# Patient Record
Sex: Male | Born: 1990 | Race: White | Hispanic: No | Marital: Married | Smoking: Current every day smoker
Health system: Southern US, Community
[De-identification: ages and names within clinical notes are randomized; demographics above are authoritative.]

## PROBLEM LIST (undated history)

## (undated) ENCOUNTER — Emergency Department (HOSPITAL_BASED_OUTPATIENT_CLINIC_OR_DEPARTMENT_OTHER): Admission: EM | Payer: Self-pay | Source: Home / Self Care

## (undated) DIAGNOSIS — F32A Depression, unspecified: Secondary | ICD-10-CM

## (undated) DIAGNOSIS — F1010 Alcohol abuse, uncomplicated: Secondary | ICD-10-CM

## (undated) DIAGNOSIS — K089 Disorder of teeth and supporting structures, unspecified: Secondary | ICD-10-CM

## (undated) DIAGNOSIS — F419 Anxiety disorder, unspecified: Secondary | ICD-10-CM

## (undated) DIAGNOSIS — F4310 Post-traumatic stress disorder, unspecified: Secondary | ICD-10-CM

## (undated) DIAGNOSIS — F329 Major depressive disorder, single episode, unspecified: Secondary | ICD-10-CM

## (undated) DIAGNOSIS — G8929 Other chronic pain: Secondary | ICD-10-CM

## (undated) DIAGNOSIS — K029 Dental caries, unspecified: Secondary | ICD-10-CM

## (undated) DIAGNOSIS — J40 Bronchitis, not specified as acute or chronic: Secondary | ICD-10-CM

---
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
## 2011-10-16 ENCOUNTER — Encounter: Payer: Self-pay | Admitting: Family Medicine

## 2011-10-16 ENCOUNTER — Emergency Department (HOSPITAL_BASED_OUTPATIENT_CLINIC_OR_DEPARTMENT_OTHER)
Admission: EM | Admit: 2011-10-16 | Discharge: 2011-10-16 | Disposition: A | Discharge status: Home / Self Care | Payer: Medicaid Other | Attending: Emergency Medicine | Admitting: Emergency Medicine

## 2011-10-16 DIAGNOSIS — M545 Low back pain, unspecified: Secondary | ICD-10-CM | POA: Insufficient documentation

## 2011-10-16 HISTORY — DX: Bronchitis, not specified as acute or chronic: J40

## 2011-10-16 MED ORDER — HYDROCODONE-ACETAMINOPHEN 5-500 MG PO TABS: 1.0000 | ORAL_TABLET | Freq: Four times a day (QID) | ORAL | Status: AC | PRN

## 2011-10-16 NOTE — ED Notes (Signed)
Pt c/o mid-back pain x 9 days after lifting heavy items and "slipping and falling down stairs". Pt able to ambulate and denies urinary symptoms.

## 2011-10-16 NOTE — ED Provider Notes (Signed)
History     CSN: [id number] Arrival date & time: 10/16/2011 10:35 AM   First MD Initiated Contact with Patient 10/16/11 1133      Chief Complaint  Patient presents with  . Back Pain    (Consider location/radiation/quality/duration/timing/severity/associated sxs/prior treatment) HPI Comments: Moving boxes into a storage facility.  Patient is a 20 y.o. male presenting with back pain. The history is provided by the patient.  Back Pain  This is a new problem. The current episode started more than 1 week ago. The problem occurs constantly. The problem has been gradually worsening. The pain is associated with lifting heavy objects. The pain is present in the lumbar spine. The quality of the pain is described as shooting and stabbing. The pain does not radiate. The pain is at a severity of 9/10. The pain is severe. The symptoms are aggravated by bending, twisting and certain positions. The pain is the same all the time. Pertinent negatives include no fever, no bowel incontinence and no bladder incontinence. He has tried NSAIDs for the symptoms. The treatment provided mild relief.    Past Medical History  Diagnosis Date  . Bronchitis     History reviewed. No pertinent past surgical history.  No family history on file.  History  Substance Use Topics  . Smoking status: Current Everyday Smoker  . Smokeless tobacco: Not on file  . Alcohol Use: Yes      Review of Systems  Constitutional: Negative for fever.  Gastrointestinal: Negative.  Negative for bowel incontinence.  Genitourinary: Negative.  Negative for bladder incontinence.  Musculoskeletal: Positive for back pain.  Neurological: Negative.   All other systems reviewed and are negative.    Allergies  Review of patient's allergies indicates no known allergies.  Home Medications  No current outpatient prescriptions on file.  BP 135/61  Pulse 97  Temp(Src) 98.2 F (36.8 C) (Oral)  Resp 16  SpO2 97%  Physical Exam    Nursing note and vitals reviewed. Constitutional: He is oriented to person, place, and time. He appears well-developed and well-nourished. No distress.  HENT:  Head: Normocephalic and atraumatic.  Neck: Normal range of motion. Neck supple.  Cardiovascular: Normal rate and regular rhythm.   Pulmonary/Chest: Effort normal and breath sounds normal.  Abdominal: Soft. He exhibits no distension. There is no tenderness.  Musculoskeletal:       Tender to palpation in the soft tissues in the lumbar region.  Neurological: He is alert and oriented to person, place, and time.  Skin: Skin is warm and dry. He is not diaphoretic.    ED Course  Procedures (including critical care time)  Labs Reviewed - No data to display No results found.   No diagnosis found.    MDM          Geoffery Lyons, MD 10/16/11 [phone]

## 2012-02-15 ENCOUNTER — Emergency Department (HOSPITAL_BASED_OUTPATIENT_CLINIC_OR_DEPARTMENT_OTHER)
Admission: EM | Admit: 2012-02-15 | Discharge: 2012-02-15 | Disposition: A | Discharge status: Home / Self Care | Payer: Self-pay | Attending: Emergency Medicine | Admitting: Emergency Medicine

## 2012-02-15 ENCOUNTER — Emergency Department (INDEPENDENT_AMBULATORY_CARE_PROVIDER_SITE_OTHER): Payer: Self-pay

## 2012-02-15 ENCOUNTER — Encounter (HOSPITAL_BASED_OUTPATIENT_CLINIC_OR_DEPARTMENT_OTHER): Payer: Self-pay

## 2012-02-15 DIAGNOSIS — R1110 Vomiting, unspecified: Secondary | ICD-10-CM | POA: Insufficient documentation

## 2012-02-15 DIAGNOSIS — J3489 Other specified disorders of nose and nasal sinuses: Secondary | ICD-10-CM | POA: Insufficient documentation

## 2012-02-15 DIAGNOSIS — R059 Cough, unspecified: Secondary | ICD-10-CM | POA: Insufficient documentation

## 2012-02-15 DIAGNOSIS — R05 Cough: Secondary | ICD-10-CM | POA: Insufficient documentation

## 2012-02-15 LAB — CBC
MCH: 30 pg (ref 26.0–34.0)
Platelets: 179 10*3/uL (ref 150–400)
RBC: 5.33 MIL/uL (ref 4.22–5.81)
WBC: 6 10*3/uL (ref 4.0–10.5)

## 2012-02-15 LAB — COMPREHENSIVE METABOLIC PANEL
ALT: 14 U/L (ref 0–53)
AST: 20 U/L (ref 0–37)
Albumin: 4 g/dL (ref 3.5–5.2)
CO2: 29 mEq/L (ref 19–32)
Calcium: 9.5 mg/dL (ref 8.4–10.5)
GFR calc non Af Amer: >90 mL/min (ref >90)
Sodium: 140 mEq/L (ref 135–145)
Total Protein: 7.1 g/dL (ref 6.0–8.3)

## 2012-02-15 LAB — DIFFERENTIAL
Eosinophils Relative: 4 % (ref 0–5)
Monocytes Absolute: 0.5 10*3/uL (ref 0.1–1.0)
Monocytes Relative: 9 % (ref 3–12)
Neutrophils Relative %: 49 % (ref 43–77)

## 2012-02-15 MED ORDER — ONDANSETRON HCL 4 MG/2ML IJ SOLN
4.0000 mg | Freq: Once | INTRAMUSCULAR | Status: AC
  Administered 2012-02-15: 4 mg via INTRAVENOUS
  Filled 2012-02-15: qty 2

## 2012-02-15 MED ORDER — SODIUM CHLORIDE 0.9 % IV SOLN
Freq: Once | INTRAVENOUS | Status: AC
  Administered 2012-02-15: 15:00:00 via INTRAVENOUS

## 2012-02-15 MED ORDER — AMOXICILLIN 500 MG PO CAPS: 500.0000 mg | ORAL_CAPSULE | Freq: Three times a day (TID) | ORAL | Status: AC

## 2012-02-15 MED ORDER — PROMETHAZINE HCL 25 MG PO TABS: 25.0000 mg | ORAL_TABLET | Freq: Four times a day (QID) | ORAL | Status: DC | PRN

## 2012-02-15 NOTE — Discharge Instructions (Signed)

## 2012-02-15 NOTE — ED Provider Notes (Signed)
History     CSN: [id number]  Arrival date & time 02/15/12  1315   First MD Initiated Contact with Patient 02/15/12 1354      Chief Complaint  Patient presents with  . Emesis    (Consider location/radiation/quality/duration/timing/severity/associated sxs/prior treatment) Patient is a 21 y.o. male presenting with vomiting. The history is provided by the patient. No language interpreter was used.  Emesis  This is a new problem. The current episode started more than 2 days ago. The problem occurs 5 to 10 times per day. The problem has been gradually worsening. The emesis has an appearance of stomach contents. There has been no fever. The fever has been present for less than 1 day. Associated symptoms include cough and URI. Pertinent negatives include no chills and no fever. Risk factors: cough and congestion,  Pt reports she has been vomitting for the past 3 days. Patient reports vomiting is not associated with cough. Patient reports he has had a cough and congestion he complains of sinus drainage and sinus congestion. Patient is concerned that he could have a sinus infection.  Past Medical History  Diagnosis Date  . Bronchitis     History reviewed. No pertinent past surgical history.  No family history on file.  History  Substance Use Topics  . Smoking status: Current Everyday Smoker  . Smokeless tobacco: Not on file  . Alcohol Use: Yes      Review of Systems  Constitutional: Negative for fever and chills.  Respiratory: Positive for cough.   Gastrointestinal: Positive for vomiting.  All other systems reviewed and are negative.    Allergies  Review of patient's allergies indicates no known allergies.  Home Medications  No current outpatient prescriptions on file.  BP 127/75  Pulse 56  Temp(Src) 98.4 F (36.9 C) (Oral)  Resp 16  Ht 5\' 6"  (1.676 m)  Wt 160 lb (72.576 kg)  BMI 25.82 kg/m2  SpO2 99%  Physical Exam  Nursing note and vitals  reviewed. Constitutional: He appears well-developed and well-nourished.  HENT:  Head: Normocephalic and atraumatic.  Right Ear: External ear normal.  Left Ear: External ear normal.  Nose: Nose normal.  Mouth/Throat: Oropharynx is clear and moist.       Maxillary sinus tenderness  Eyes: Conjunctivae are normal. Pupils are equal, round, and reactive to light.  Neck: Normal range of motion. Neck supple.  Cardiovascular: Normal rate.   Pulmonary/Chest: Effort normal.  Abdominal: Soft.  Musculoskeletal: Normal range of motion.  Neurological: He is alert.  Skin: Skin is warm.  Psychiatric: He has a normal mood and affect.    ED Course  Procedures (including critical care time)  Labs Reviewed  CBC - Abnormal; Notable for the following:    MCHC 36.7 (*)    All other components within normal limits  DIFFERENTIAL  COMPREHENSIVE METABOLIC PANEL   Dg Chest 2 View  02/15/2012  *RADIOLOGY REPORT*  Clinical Data: Cough, vomiting.  CHEST - 2 VIEW  Comparison: None  Findings: Heart and mediastinal contours are within normal limits. No focal opacities or effusions.  No acute bony abnormality.  IMPRESSION: Normal study.  Original Report Authenticated By: Cyndie Chime, M.D.     No diagnosis found.    MDM  Pt given Iv NS x 999,  Zofran Iv.  Pt reports feeling better.        Langston Masker, [place] 02/15/12 1620

## 2012-02-15 NOTE — ED Notes (Signed)
C/o n/v/d,HA,body aches x 3 days

## 2012-02-16 NOTE — ED Provider Notes (Signed)
Medical screening examination/treatment/procedure(s) were performed by non-physician practitioner and as supervising physician I was immediately available for consultation/collaboration.   Gwyneth Sprout, MD 02/16/12 [phone]

## 2012-05-19 ENCOUNTER — Encounter (HOSPITAL_BASED_OUTPATIENT_CLINIC_OR_DEPARTMENT_OTHER): Payer: Self-pay | Admitting: *Deleted

## 2012-05-19 DIAGNOSIS — S025XXA Fracture of tooth (traumatic), initial encounter for closed fracture: Secondary | ICD-10-CM | POA: Insufficient documentation

## 2012-05-19 DIAGNOSIS — F17200 Nicotine dependence, unspecified, uncomplicated: Secondary | ICD-10-CM | POA: Insufficient documentation

## 2012-05-19 DIAGNOSIS — X58XXXA Exposure to other specified factors, initial encounter: Secondary | ICD-10-CM | POA: Insufficient documentation

## 2012-05-19 NOTE — ED Notes (Signed)
Pt describes dental pain since lunch (bottom left) Tried Advil and "previous pain med without relief" Also c/o back pain since Tues.Hx same.

## 2012-05-20 ENCOUNTER — Emergency Department (HOSPITAL_BASED_OUTPATIENT_CLINIC_OR_DEPARTMENT_OTHER)
Admission: EM | Admit: 2012-05-20 | Discharge: 2012-05-20 | Disposition: A | Discharge status: Home / Self Care | Payer: Self-pay | Attending: Emergency Medicine | Admitting: Emergency Medicine

## 2012-05-20 DIAGNOSIS — M545 Low back pain: Secondary | ICD-10-CM

## 2012-05-20 DIAGNOSIS — K0889 Other specified disorders of teeth and supporting structures: Secondary | ICD-10-CM

## 2012-05-20 MED ORDER — PENICILLIN V POTASSIUM 500 MG PO TABS: 500.0000 mg | ORAL_TABLET | Freq: Four times a day (QID) | ORAL | Status: AC

## 2012-05-20 MED ORDER — OXYCODONE-ACETAMINOPHEN 5-325 MG PO TABS
2.0000 | ORAL_TABLET | Freq: Once | ORAL | Status: AC
  Administered 2012-05-20: 2 via ORAL
  Filled 2012-05-20: qty 2

## 2012-05-20 MED ORDER — OXYCODONE-ACETAMINOPHEN 5-325 MG PO TABS: 1.0000 | ORAL_TABLET | Freq: Four times a day (QID) | ORAL | Status: AC | PRN

## 2012-05-20 NOTE — ED Notes (Signed)
rx x 2 for penicillin and percocet- pt has a ride

## 2012-05-20 NOTE — Discharge Instructions (Signed)
Back Pain, Adult Low back pain is very common. About 1 in 5 people have back pain.The cause of low back pain is rarely dangerous. The pain often gets better over time.About half of people with a sudden onset of back pain feel better in just 2 weeks. About 8 in 10 people feel better by 6 weeks.  CAUSES Some common causes of back pain include:  Strain of the muscles or ligaments supporting the spine.   Wear and tear (degeneration) of the spinal discs.   Arthritis.   Direct injury to the back.  DIAGNOSIS Most of the time, the direct cause of low back pain is not known.However, back pain can be treated effectively even when the exact cause of the pain is unknown.Answering your caregiver's questions about your overall health and symptoms is one of the most accurate ways to make sure the cause of your pain is not dangerous. If your caregiver needs more information, he or she may order lab work or imaging tests (X-rays or MRIs).However, even if imaging tests show changes in your back, this usually does not require surgery. HOME CARE INSTRUCTIONS For many people, back pain returns.Since low back pain is rarely dangerous, it is often a condition that people can learn to manageon their own.   Remain active. It is stressful on the back to sit or stand in one place. Do not sit, drive, or stand in one place for more than 30 minutes at a time. Take short walks on level surfaces as soon as pain allows.Try to increase the length of time you walk each day.   Do not stay in bed.Resting more than 1 or 2 days can delay your recovery.   Do not avoid exercise or work.Your body is made to move.It is not dangerous to be active, even though your back may hurt.Your back will likely heal faster if you return to being active before your pain is gone.   Pay attention to your body when you bend and lift. Many people have less discomfortwhen lifting if they bend their knees, keep the load close to their  bodies,and avoid twisting. Often, the most comfortable positions are those that put less stress on your recovering back.   Find a comfortable position to sleep. Use a firm mattress and lie on your side with your knees slightly bent. If you lie on your back, put a pillow under your knees.   Only take over-the-counter or prescription medicines as directed by your caregiver. Over-the-counter medicines to reduce pain and inflammation are often the most helpful.Your caregiver may prescribe muscle relaxant drugs.These medicines help dull your pain so you can more quickly return to your normal activities and healthy exercise.   Put ice on the injured area.   Put ice in a plastic bag.   Place a towel between your skin and the bag.   Leave the ice on for 15 to 20 minutes, 3 to 4 times a day for the first 2 to 3 days. After that, ice and heat may be alternated to reduce pain and spasms.   Ask your caregiver about trying back exercises and gentle massage. This may be of some benefit.   Avoid feeling anxious or stressed.Stress increases muscle tension and can worsen back pain.It is important to recognize when you are anxious or stressed and learn ways to manage it.Exercise is a great option.  SEEK MEDICAL CARE IF:  You have pain that is not relieved with rest or medicine.   You have   back pain. It is important to recognize when you are anxious or stressed and learn ways to manage it. Exercise is a great option.   SEEK MEDICAL CARE IF:   You have pain that is not relieved with rest or medicine.    You have pain that does not improve in 1 week.    You have new symptoms.    You are generally not feeling well.   SEEK IMMEDIATE MEDICAL CARE IF:     You have pain that radiates from your back into your legs.    You develop new bowel or bladder control problems.    You have unusual weakness or numbness in your arms or legs.    You develop nausea or vomiting.    You develop abdominal pain.    You feel faint.    Document Released: 12/11/2005 Document Revised: 11/30/2011 Document Reviewed: 05/01/2011  [employer] Patient Information 2012 [employer].    Back Pain, Adult  Low back pain is very common. About 1 in 5 people have back pain. The cause of low back pain is rarely dangerous. The pain often gets better over time. About half of people with a sudden onset of back pain feel better in just 2 weeks. About 8 in 10 people feel better by 6 weeks.    CAUSES  Some common causes of back pain include:   Strain of the muscles or ligaments supporting the spine.    Wear and tear (degeneration) of the spinal discs.    Arthritis.    Direct injury to the back.   DIAGNOSIS  Most of the time, the direct cause of low back pain is not known. However, back pain can be treated effectively even when the exact cause of the pain is unknown. Answering your caregiver's questions about your overall health and symptoms is one of the most accurate ways to make sure the cause of your pain is not dangerous. If your caregiver needs more information, he or she may order lab work or imaging tests (X-rays or MRIs). However, even if imaging tests show changes in your back, this usually does not require surgery.  HOME CARE INSTRUCTIONS  For many people, back pain returns. Since low back pain is rarely dangerous, it is often a condition that people can learn to manage on their own.     Remain active. It is stressful on the back to sit or stand in one place. Do not sit, drive, or stand in one place for more than 30 minutes at a time. Take short walks on level surfaces as soon as pain allows. Try to increase the length of time you walk each day.    Do not stay in bed. Resting more than 1 or 2 days can delay your recovery.    Do not avoid exercise or work. Your body is made to move. It is not dangerous to be active, even though your back may hurt. Your back will likely heal faster if you return to being active before your pain is gone.     Pay attention to your body when you  bend and lift. Many people have less discomfort when lifting if they bend their knees, keep the load close to their bodies, and avoid twisting. Often, the most comfortable positions are those that put less stress on your recovering back.    Find a comfortable position to sleep. Use a firm mattress and lie on your side with your knees slightly bent. If you lie on   4 times a day for the first 2 to 3 days. After that, ice and heat may be alternated to reduce pain and spasms.   Ask your caregiver about trying back exercises and gentle massage. This may be of some benefit.   Avoid feeling anxious or stressed.Stress increases muscle tension and can worsen back pain.It is important to recognize when you are anxious or stressed and learn ways to manage it.Exercise is a great option.  SEEK MEDICAL CARE IF:  You have pain that is not relieved with rest or medicine.   You have pain that does not improve in 1 week.   You have new symptoms.   You are generally not feeling well.  SEEK IMMEDIATE MEDICAL CARE IF:   You have pain that radiates from your back into your legs.   You develop new bowel or bladder control problems.   You have unusual weakness or numbness in your arms or legs.   You develop nausea or vomiting.   You develop abdominal pain.   You feel faint.  Document Released: 12/11/2005 Document Revised: 11/30/2011 Document Reviewed: 05/01/2011 [facility] Patient Information 2012 [place], [place].Back Pain, Adult Low back pain is very common. About 1 in 5 people have back pain.The cause of low back pain is rarely dangerous. The pain often gets better over  time.About half of people with a sudden onset of back pain feel better in just 2 weeks. About 8 in 10 people feel better by 6 weeks.  CAUSES Some common causes of back pain include:  Strain of the muscles or ligaments supporting the spine.   Wear and tear (degeneration) of the spinal discs.   Arthritis.   Direct injury to the back.  DIAGNOSIS Most of the time, the direct cause of low back pain is not known.However, back pain can be treated effectively even when the exact cause of the pain is unknown.Answering your caregiver's questions about your overall health and symptoms is one of the most accurate ways to make sure the cause of your pain is not dangerous. If your caregiver needs more information, he or she may order lab work or imaging tests (X-rays or MRIs).However, even if imaging tests show changes in your back, this usually does not require surgery. HOME CARE INSTRUCTIONS For many people, back pain returns.Since low back pain is rarely dangerous, it is often a condition that people can learn to [facility] their own.   Remain active. It is stressful on the back to sit or stand in one place. Do not sit, drive, or stand in one place for more than 30 minutes at a time. Take short walks on level surfaces as soon as pain allows.Try to increase the length of time you walk each day.   Do not stay in bed.Resting more than 1 or 2 days can delay your recovery.   Do not avoid exercise or work.Your body is made to move.It is not dangerous to be active, even though your back may hurt.Your back will likely heal faster if you return to being active before your pain is gone.   Pay attention to your body when you bend and lift. Many people have less discomfortwhen lifting if they bend their knees, keep the load close to their bodies,and avoid twisting. Often, the most comfortable positions are those that put less stress on your recovering back.   Find a comfortable position to sleep. Use a  firm mattress and lie on your side with your knees slightly bent. If you lie  on your back, put a pillow under your knees.   Only take over-the-counter or prescription medicines as directed by your caregiver. Over-the-counter medicines to reduce pain and inflammation are often the most helpful.Your caregiver may prescribe muscle relaxant drugs.These medicines help dull your pain so you can more quickly return to your normal activities and healthy exercise.   Put ice on the injured area.   Put ice in a plastic bag.   Place a towel between your skin and the bag.   Leave the ice on for 15 to 20 minutes, 3 to 4 times a day for the first 2 to 3 days. After that, ice and heat may be alternated to reduce pain and spasms.   Ask your caregiver about trying back exercises and gentle massage. This may be of some benefit.   Avoid feeling anxious or stressed.Stress increases muscle tension and can worsen back pain.It is important to recognize when you are anxious or stressed and learn ways to manage it.Exercise is a great option.  SEEK MEDICAL CARE IF:  You have pain that is not relieved with rest or medicine.   You have pain that does not improve in 1 week.   You have new symptoms.   You are generally not feeling well.  SEEK IMMEDIATE MEDICAL CARE IF:   You have pain that radiates from your back into your legs.   You develop new bowel or bladder control problems.   You have unusual weakness or numbness in your arms or legs.   You develop nausea or vomiting.   You develop abdominal pain.   You feel faint.  Document Released: 12/11/2005 Document Revised: 11/30/2011 Document Reviewed: 05/01/2011 [facility] Patient Information 2012 [place], [place].

## 2012-05-20 NOTE — ED Provider Notes (Signed)
History     CSN: [id number]  Arrival date & time 05/19/12  2331   First MD Initiated Contact with Patient 05/20/12 0154      Chief Complaint  Patient presents with  . Dental Pain    (Consider location/radiation/quality/duration/timing/severity/associated sxs/prior treatment) HPI Is a 21 year old white male with a long-standing history of fractured left lower second molar. He began to have pain in that tooth yesterday about lunchtime and the pain is subsequently become severe. It is worse with eating or drinking. He has taken Advil and hydrocodone without relief. He is also complaining of low back pain for the last 6 days following a fall. The pain was not immediate but started the next day. He has a history of similar low back pain. He does not have a Education officer, community.  Past Medical History  Diagnosis Date  . Bronchitis     History reviewed. No pertinent past surgical history.  History reviewed. No pertinent family history.  History  Substance Use Topics  . Smoking status: Current Everyday Smoker  . Smokeless tobacco: Not on file  . Alcohol Use: Yes      Review of Systems  All other systems reviewed and are negative.    Allergies  Review of patient's allergies indicates no known allergies.  Home Medications  No current outpatient prescriptions on file.  BP 142/94  Pulse 90  Temp(Src) 98.2 F (36.8 C) (Oral)  Resp 20  Ht 5\' 6"  (1.676 m)  Wt 156 lb (70.761 kg)  BMI 25.18 kg/m2  SpO2 97%  Physical Exam General: Well-developed, well-nourished male in no acute distress; appearance consistent with age of record HENT: normocephalic, atraumatic; multiple amalgam dental restorations; fractured left lower second molar with superimposed caries, tender to percussion Eyes: pupils equal round and reactive to light; extraocular muscles intact Neck: supple Heart: regular rate and rhythm Lungs: clear to auscultation bilaterally Abdomen: soft; nondistended Back: Paraspinal  lumbar tenderness Extremities: No deformity; full range of motion normal Neurologic: Awake, alert and oriented; motor function intact in all extremities and symmetric; no facial droop Skin: Warm and dry Psychiatric: Flat affect    ED Course  Procedures (including critical care time)     MDM          Hanley Seamen, MD 05/20/12 0159

## 2012-05-22 ENCOUNTER — Telehealth (HOSPITAL_BASED_OUTPATIENT_CLINIC_OR_DEPARTMENT_OTHER): Payer: Self-pay | Admitting: *Deleted

## 2012-05-22 NOTE — ED Notes (Signed)
Patient called stating he needs a new referral to a dentist.  Explained that the dentist on call for [facility] was the one we referred him to and they have agreed to see our patients.  Patient encouraged to tell dentist when calling that he was seen at the [facility] ED.  Patient states he does not have insurance.  Info given for the [facility] [employer] for possible assistance.  Encouraged that this tooth needs to be fixed or problems will continue and return.

## 2012-12-05 ENCOUNTER — Emergency Department (HOSPITAL_BASED_OUTPATIENT_CLINIC_OR_DEPARTMENT_OTHER)
Admission: EM | Admit: 2012-12-05 | Discharge: 2012-12-06 | Disposition: A | Discharge status: Home / Self Care | Payer: Self-pay | Attending: Emergency Medicine | Admitting: Emergency Medicine

## 2012-12-05 ENCOUNTER — Encounter (HOSPITAL_BASED_OUTPATIENT_CLINIC_OR_DEPARTMENT_OTHER): Payer: Self-pay | Admitting: Emergency Medicine

## 2012-12-05 ENCOUNTER — Emergency Department (HOSPITAL_BASED_OUTPATIENT_CLINIC_OR_DEPARTMENT_OTHER): Payer: Self-pay

## 2012-12-05 DIAGNOSIS — F17200 Nicotine dependence, unspecified, uncomplicated: Secondary | ICD-10-CM | POA: Insufficient documentation

## 2012-12-05 DIAGNOSIS — Z8709 Personal history of other diseases of the respiratory system: Secondary | ICD-10-CM | POA: Insufficient documentation

## 2012-12-05 DIAGNOSIS — R569 Unspecified convulsions: Secondary | ICD-10-CM

## 2012-12-05 DIAGNOSIS — R51 Headache: Secondary | ICD-10-CM | POA: Insufficient documentation

## 2012-12-05 LAB — CBC WITH DIFFERENTIAL/PLATELET
Basophils Relative: 1 % (ref 0–1)
Eosinophils Absolute: 0.2 10*3/uL (ref 0.0–0.7)
HCT: 42.2 % (ref 39.0–52.0)
Hemoglobin: 15.7 g/dL (ref 13.0–17.0)
Lymphs Abs: 2.3 10*3/uL (ref 0.7–4.0)
MCH: 30.9 pg (ref 26.0–34.0)
MCHC: 37.2 g/dL — ABNORMAL HIGH (ref 30.0–36.0)
MCV: 83.1 fL (ref 78.0–100.0)
Monocytes Absolute: 0.7 10*3/uL (ref 0.1–1.0)
Neutro Abs: 4.1 10*3/uL (ref 1.7–7.7)

## 2012-12-05 LAB — COMPREHENSIVE METABOLIC PANEL
BUN: 13 mg/dL (ref 6–23)
CO2: 26 mEq/L (ref 19–32)
Calcium: 9.4 mg/dL (ref 8.4–10.5)
GFR calc Af Amer: >90 mL/min (ref >90)
GFR calc non Af Amer: >90 mL/min (ref >90)
Glucose, Bld: 99 mg/dL (ref 70–99)
Total Protein: 6.6 g/dL (ref 6.0–8.3)

## 2012-12-05 MED ORDER — PROMETHAZINE HCL 25 MG/ML IJ SOLN
25.0000 mg | Freq: Once | INTRAMUSCULAR | Status: AC
  Administered 2012-12-05: 25 mg via INTRAMUSCULAR
  Filled 2012-12-05: qty 1

## 2012-12-05 MED ORDER — KETOROLAC TROMETHAMINE 60 MG/2ML IM SOLN
60.0000 mg | Freq: Once | INTRAMUSCULAR | Status: AC
  Administered 2012-12-05: 60 mg via INTRAMUSCULAR
  Filled 2012-12-05: qty 2

## 2012-12-05 NOTE — ED Provider Notes (Signed)
History     CSN: [id number]  Arrival date & time 12/05/12  2215   First MD Initiated Contact with Patient 12/05/12 2301      Chief Complaint  Patient presents with  . Headache    (Consider location/radiation/quality/duration/timing/severity/associated sxs/prior treatment) HPI Comments: Patient presents here with headaches for the past two weeks, now having blurry vision.  This evening he reports he passed out and had seizure-like activity.  This was witnessed by his girlfriend.  No bowel or bladder incontinence.  He tells me he has been "taking aleve like skittles" but has obtained no relief.  Patient is a 21 y.o. male presenting with headaches. The history is provided by the patient.  Headache  This is a new problem. Episode onset: 2 weeks ago. The problem occurs constantly. The problem has been gradually worsening. The headache is associated with bright light and activity. The pain is located in the right unilateral region. The quality of the pain is described as throbbing. The pain is severe. The pain does not radiate. Pertinent negatives include no fever, no nausea and no vomiting.    Past Medical History  Diagnosis Date  . Bronchitis     History reviewed. No pertinent past surgical history.  History reviewed. No pertinent family history.  History  Substance Use Topics  . Smoking status: Current Every Day Smoker  . Smokeless tobacco: Not on file  . Alcohol Use: Yes      Review of Systems  Constitutional: Negative for fever.  Gastrointestinal: Negative for nausea and vomiting.  Neurological: Positive for headaches.  All other systems reviewed and are negative.    Allergies  Review of patient's allergies indicates no known allergies.  Home Medications  No current outpatient prescriptions on file.  BP 158/109  Pulse 80  Temp 98.9 F (37.2 C)  Resp 16  SpO2 100%  Physical Exam  Nursing note and vitals reviewed. Constitutional: He is oriented to person,  place, and time. He appears well-developed and well-nourished. No distress.  HENT:  Head: Normocephalic and atraumatic.  Mouth/Throat: Oropharynx is clear and moist.  Eyes: EOM are normal. Pupils are equal, round, and reactive to light.       No papilledema.  Neck: Normal range of motion. Neck supple.  Cardiovascular: Normal rate.   No murmur heard. Pulmonary/Chest: Effort normal and breath sounds normal. No respiratory distress.  Abdominal: Soft. Bowel sounds are normal.  Musculoskeletal: Normal range of motion.  Lymphadenopathy:    He has no cervical adenopathy.  Neurological: He is alert and oriented to person, place, and time. No cranial nerve deficit. He exhibits normal muscle tone. Coordination normal.  Skin: Skin is warm and dry. He is not diaphoretic.    ED Course  Procedures (including critical care time)   Labs Reviewed  CBC WITH DIFFERENTIAL  COMPREHENSIVE METABOLIC PANEL   No results found.   No diagnosis found.    MDM  The patient presents with headaches and apparent seizure activity.  The labs, ct, and exam are unremarkable.  There are no indications for LP.  Will discharge to home and give neurology follow up information.          Geoffery Lyons, MD 12/06/12 [phone]

## 2012-12-05 NOTE — ED Notes (Signed)
Pt reports mild headache x 2 week, then started having severe migraine this week took migraine without relief, pt reports getting dizzy today upon standing up, past out loss consiousness and had no recollection of event, significant other reports pt's eye rolled in back of head, pt was unresponsive and he had a "shaking spell"

## 2012-12-06 MED ORDER — TRAMADOL HCL 50 MG PO TABS: 50.0000 mg | ORAL_TABLET | Freq: Four times a day (QID) | ORAL | Status: DC | PRN

## 2013-12-01 ENCOUNTER — Emergency Department (HOSPITAL_BASED_OUTPATIENT_CLINIC_OR_DEPARTMENT_OTHER)
Admission: EM | Admit: 2013-12-01 | Discharge: 2013-12-01 | Disposition: A | Discharge status: Home / Self Care | Payer: Medicaid Other | Attending: Emergency Medicine | Admitting: Emergency Medicine

## 2013-12-01 ENCOUNTER — Encounter (HOSPITAL_BASED_OUTPATIENT_CLINIC_OR_DEPARTMENT_OTHER): Payer: Self-pay | Admitting: Emergency Medicine

## 2013-12-01 DIAGNOSIS — K047 Periapical abscess without sinus: Secondary | ICD-10-CM

## 2013-12-01 DIAGNOSIS — K0889 Other specified disorders of teeth and supporting structures: Secondary | ICD-10-CM

## 2013-12-01 DIAGNOSIS — F17200 Nicotine dependence, unspecified, uncomplicated: Secondary | ICD-10-CM | POA: Insufficient documentation

## 2013-12-01 DIAGNOSIS — K044 Acute apical periodontitis of pulpal origin: Secondary | ICD-10-CM | POA: Insufficient documentation

## 2013-12-01 DIAGNOSIS — K089 Disorder of teeth and supporting structures, unspecified: Secondary | ICD-10-CM | POA: Insufficient documentation

## 2013-12-01 DIAGNOSIS — Z8709 Personal history of other diseases of the respiratory system: Secondary | ICD-10-CM | POA: Insufficient documentation

## 2013-12-01 DIAGNOSIS — K029 Dental caries, unspecified: Secondary | ICD-10-CM | POA: Insufficient documentation

## 2013-12-01 MED ORDER — HYDROCODONE-ACETAMINOPHEN 5-325 MG PO TABS
2.0000 | ORAL_TABLET | Freq: Once | ORAL | Status: AC
  Administered 2013-12-01: 2 via ORAL
  Filled 2013-12-01: qty 2

## 2013-12-01 MED ORDER — AMOXICILLIN 500 MG PO CAPS: 500.0000 mg | ORAL_CAPSULE | Freq: Three times a day (TID) | ORAL | Status: DC

## 2013-12-01 MED ORDER — HYDROCODONE-ACETAMINOPHEN 5-325 MG PO TABS: 1.0000 | ORAL_TABLET | ORAL | Status: DC | PRN

## 2013-12-01 NOTE — ED Notes (Signed)
Toothache x 2 months on and off.

## 2013-12-01 NOTE — ED Provider Notes (Signed)
CSN: [id number]     Arrival date & time 12/01/13  1906 History   First MD Initiated Contact with Patient 12/01/13 1913     Chief Complaint  Patient presents with  . Dental Pain   (Consider location/radiation/quality/duration/timing/severity/associated sxs/prior Treatment) HPI Comments: Pt presents to the ED complaining of left lower dental pain x 1 year intermittently worsening today over the past few hours. Pain severe, 10/10, worse with talking, breathing and eating. He does not have a dentist. Tried taking ibuprofen without relief. Denies fever, difficulty swallowing.  Patient is a 22 y.o. male presenting with tooth pain. The history is provided by the patient.  Dental Pain   Past Medical History  Diagnosis Date  . Bronchitis    History reviewed. No pertinent past surgical history. No family history on file. History  Substance Use Topics  . Smoking status: Current Every Day Smoker -- 1.50 packs/day    Types: Cigarettes  . Smokeless tobacco: Not on file  . Alcohol Use: Yes    Review of Systems  HENT: Positive for dental problem.   All other systems reviewed and are negative.    Allergies  Review of patient's allergies indicates no known allergies.  Home Medications   Current Outpatient Rx  Name  Route  Sig  Dispense  Refill  . amoxicillin (AMOXIL) 500 MG capsule   Oral   Take 1 capsule (500 mg total) by mouth 3 (three) times daily.   21 capsule   0   . HYDROcodone-acetaminophen (NORCO/VICODIN) 5-325 MG per tablet   Oral   Take 1-2 tablets by mouth every 4 (four) hours as needed.   6 tablet   0   . traMADol (ULTRAM) 50 MG tablet   Oral   Take 1 tablet (50 mg total) by mouth every 6 (six) hours as needed for pain.   15 tablet   0    BP 150/97  Pulse 64  Temp(Src) 97.7 F (36.5 C) (Oral)  Resp 18  Ht 5\' 6"  (1.676 m)  Wt 170 lb (77.111 kg)  BMI 27.45 kg/m2  SpO2 99% Physical Exam  Nursing note and vitals reviewed. Constitutional: He is oriented to  person, place, and time. He appears well-developed and well-nourished. No distress.  HENT:  Head: Normocephalic and atraumatic.  Mouth/Throat: Oropharynx is clear and moist.  Poor dentition throughout. Multiple dental caries and decaying teeth. Erythema and edema without abscess around lower left third molar. No trismus.  Eyes: Conjunctivae are normal.  Neck: Normal range of motion. Neck supple.  Cardiovascular: Normal rate, regular rhythm and normal heart sounds.   Pulmonary/Chest: Effort normal and breath sounds normal.  Abdominal: Bowel sounds are normal.  Musculoskeletal: Normal range of motion. He exhibits no edema.  Neurological: He is alert and oriented to person, place, and time.  Skin: Skin is warm and dry. He is not diaphoretic.  Psychiatric: He has a normal mood and affect. His behavior is normal.    ED Course  Procedures (including critical care time) Labs Review Labs Reviewed - No data to display Imaging Review No results found.  EKG Interpretation   None       MDM   1. Pain, dental   2. Dental infection     Dental pain associated with dental infection. No evidence of dental abscess. Patient is afebrile, non toxic appearing and swallowing secretions well. I gave patient referral to dentist and stressed the importance of dental follow up for ultimate management of dental pain. I will  also give amoxicillin and pain control. Patient voices understanding and is agreeable to plan.     Trevor Mace, PA-C 12/01/13 1942

## 2013-12-01 NOTE — ED Provider Notes (Signed)
Medical screening examination/treatment/procedure(s) were performed by non-physician practitioner and as supervising physician I was immediately available for consultation/collaboration.  EKG Interpretation   None        Juliet Rude. Rubin Payor, MD 12/01/13 2330

## 2014-02-22 ENCOUNTER — Emergency Department (HOSPITAL_BASED_OUTPATIENT_CLINIC_OR_DEPARTMENT_OTHER)
Admission: EM | Admit: 2014-02-22 | Discharge: 2014-02-22 | Disposition: A | Discharge status: Home / Self Care | Payer: Medicaid Other | Attending: Emergency Medicine | Admitting: Emergency Medicine

## 2014-02-22 ENCOUNTER — Encounter (HOSPITAL_BASED_OUTPATIENT_CLINIC_OR_DEPARTMENT_OTHER): Payer: Self-pay | Admitting: Emergency Medicine

## 2014-02-22 DIAGNOSIS — K056 Periodontal disease, unspecified: Secondary | ICD-10-CM | POA: Insufficient documentation

## 2014-02-22 DIAGNOSIS — Z8709 Personal history of other diseases of the respiratory system: Secondary | ICD-10-CM | POA: Insufficient documentation

## 2014-02-22 DIAGNOSIS — F17200 Nicotine dependence, unspecified, uncomplicated: Secondary | ICD-10-CM | POA: Insufficient documentation

## 2014-02-22 DIAGNOSIS — K029 Dental caries, unspecified: Secondary | ICD-10-CM

## 2014-02-22 DIAGNOSIS — K069 Disorder of gingiva and edentulous alveolar ridge, unspecified: Secondary | ICD-10-CM

## 2014-02-22 DIAGNOSIS — K089 Disorder of teeth and supporting structures, unspecified: Secondary | ICD-10-CM | POA: Insufficient documentation

## 2014-02-22 MED ORDER — AMOXICILLIN 500 MG PO CAPS
500.0000 mg | ORAL_CAPSULE | Freq: Once | ORAL | Status: AC
  Administered 2014-02-22: 500 mg via ORAL
  Filled 2014-02-22: qty 1

## 2014-02-22 MED ORDER — HYDROCODONE-ACETAMINOPHEN 5-325 MG PO TABS
1.0000 | ORAL_TABLET | Freq: Once | ORAL | Status: AC
  Administered 2014-02-22: 1 via ORAL
  Filled 2014-02-22: qty 1

## 2014-02-22 MED ORDER — AMOXICILLIN 500 MG PO CAPS: 500.0000 mg | ORAL_CAPSULE | Freq: Three times a day (TID) | ORAL | Status: DC

## 2014-02-22 MED ORDER — HYDROCODONE-ACETAMINOPHEN 5-325 MG PO TABS: 2.0000 | ORAL_TABLET | ORAL | Status: DC | PRN

## 2014-02-22 MED ORDER — NAPROXEN 500 MG PO TABS: 500.0000 mg | ORAL_TABLET | Freq: Two times a day (BID) | ORAL | Status: DC

## 2014-02-22 NOTE — ED Provider Notes (Signed)
CSN: [id number]     Arrival date & time 02/22/14  2159 History   First MD Initiated Contact with Patient 02/22/14 2218     Chief Complaint  Patient presents with  . Dental Pain     (Consider location/radiation/quality/duration/timing/severity/associated sxs/prior Treatment) Patient is a 23 y.o. male presenting with tooth pain. The history is provided by the patient.  Dental Pain Location:  Upper and lower Upper teeth location:  14/LU 1st molar and 15/LU 2nd molar Lower teeth location:  17/LL 3rd molar, 18/LL 2nd molar and 19/LL 1st molar Quality:  Throbbing and constant Severity:  Severe Onset quality:  Gradual Duration:  3 hours Timing:  Constant Progression:  Worsening Chronicity:  New Relieved by:  Nothing Worsened by:  Cold food/drink, jaw movement, pressure and touching Ineffective treatments:  NSAIDs  TAYON PAREKH is a 23 y.o. male who presents to the ED with dental pain in the left upper and lower dental area. He was eating and bit down and had severe pain. The pain has gradually increased. He has a lot of decayed teeth and knows that they are a problem. He is getting dental insurance next week at work and will be able to see a dentist after that.   Past Medical History  Diagnosis Date  . Bronchitis    History reviewed. No pertinent past surgical history. History reviewed. No pertinent family history. History  Substance Use Topics  . Smoking status: Current Every Day Smoker -- 1.50 packs/day    Types: Cigarettes  . Smokeless tobacco: Not on file  . Alcohol Use: Yes    Review of Systems Negative except as stated in HPI   Allergies  Review of patient's allergies indicates no known allergies.  Home Medications   Current Outpatient Rx  Name  Route  Sig  Dispense  Refill  . amoxicillin (AMOXIL) 500 MG capsule   Oral   Take 1 capsule (500 mg total) by mouth 3 (three) times daily.   21 capsule   0   . HYDROcodone-acetaminophen (NORCO/VICODIN) 5-325 MG per  tablet   Oral   Take 1-2 tablets by mouth every 4 (four) hours as needed.   6 tablet   0   . traMADol (ULTRAM) 50 MG tablet   Oral   Take 1 tablet (50 mg total) by mouth every 6 (six) hours as needed for pain.   15 tablet   0    BP 138/85  Pulse 79  Temp(Src) 97.6 F (36.4 C) (Oral)  Resp 18  Ht 5\' 6"  (1.676 m)  Wt 171 lb (77.565 kg)  BMI 27.61 kg/m2  SpO2 100% Physical Exam  Nursing note and vitals reviewed. Constitutional: He is oriented to person, place, and time. He appears well-developed and well-nourished. No distress.  HENT:  Head: Atraumatic.  Right Ear: Tympanic membrane normal.  Left Ear: Tympanic membrane normal.  Nose: Nose normal.  Mouth/Throat: Uvula is midline, oropharynx is clear and moist and mucous membranes are normal.    Multiple caries, gum surrounding upper and lower teeth with erythema and swelling, tender on exam.   Eyes: EOM are normal.  Neck: Neck supple.  Cardiovascular: Normal rate and regular rhythm.   Pulmonary/Chest: Effort normal. He has no wheezes. He has no rales.  Musculoskeletal: Normal range of motion.  Neurological: He is alert and oriented to person, place, and time. No cranial nerve deficit.  Skin: Skin is warm and dry.  Psychiatric: He has a normal mood and affect. His behavior is  normal.    ED Course  Procedures  MDM  23 y.o. male with multiple dental caries and dental pain and gum disease. Will treat with antibiotics and pain medication. He will schedule follow up with a dentist.  Discussed with the patient and all questioned fully answered. He will return if any problems arise.    Medication List    TAKE these medications       amoxicillin 500 MG capsule  Commonly known as:  AMOXIL  Take 1 capsule (500 mg total) by mouth 3 (three) times daily.     HYDROcodone-acetaminophen 5-325 MG per tablet  Commonly known as:  NORCO/VICODIN  Take 2 tablets by mouth every 4 (four) hours as needed.     naproxen 500 MG tablet    Commonly known as:  NAPROSYN  Take 1 tablet (500 mg total) by mouth 2 (two) times daily.      ASK your doctor about these medications       traMADol 50 MG tablet  Commonly known as:  ULTRAM  Take 1 tablet (50 mg total) by mouth every 6 (six) hours as needed for pain.           Janne NapoleonHope M Aadil Sur, [place]NP 02/22/14 [id number]

## 2014-02-22 NOTE — ED Notes (Signed)
Would like some pain relief and antibiotic, no insurance currently, will obtain insurance soon

## 2014-02-22 NOTE — Discharge Instructions (Signed)
Dental Pain Toothache is pain in or around a tooth. It may get worse with chewing or with cold or heat.  HOME CARE  Your dentist may use a numbing medicine during treatment. If so, you may need to avoid eating until the medicine wears off. Ask your dentist about this.  Only take medicine as told by your dentist or doctor.  Avoid chewing food near the painful tooth until after all treatment is done. Ask your dentist about this. GET HELP RIGHT AWAY IF:   The problem gets worse or new problems appear.  You have a fever.  There is redness and puffiness (swelling) of the face, jaw, or neck.  You cannot open your mouth.  There is pain in the jaw.  There is very bad pain that is not helped by medicine. MAKE SURE YOU:   Understand these instructions.  Will watch your condition.  Will get help right away if you are not doing well or get worse. Document Released: 05/29/2008 Document Revised: 03/04/2012 Document Reviewed: 05/29/2008 [facility] Patient Information 2014 [place], [place].  Dental Caries  Dental caries (also called tooth decay) is the most common oral disease. It can occur at any age, but is more common in children and young adults.  HOW DENTAL CARIES DEVELOPS  The process of decay begins when bacteria and foods (particularly sugars and starches) combine in your mouth to produce plaque. Plaque is a substance that sticks to the hard, outer surface of a tooth (enamel). The bacteria in plaque produce acids that attack enamel. These acids may also attack the root surface of a tooth (cementum) if it is exposed. Repeated attacks dissolve these surfaces and create holes in the tooth (cavities). If left untreated, the acids destroy the other layers of the tooth.  RISK FACTORS  Frequent sipping of sugary beverages.   Frequent snacking on sugary and starchy foods, especially those that easily get stuck in the teeth.   Poor oral hygiene.   Dry mouth.   Substance abuse such as  methamphetamine abuse.   Broken or poor-fitting dental restorations.   Eating disorders.   Gastroesophageal reflux disease (GERD).   Certain radiation treatments to the head and neck. SYMPTOMS In the early stages of dental caries, symptoms are seldom present. Sometimes white, chalky areas may be seen on the enamel or other tooth layers. In later stages, symptoms may include:  Pits and holes on the enamel.  Toothache after sweet, hot, or cold foods or drinks are consumed.  Pain around the tooth.  Swelling around the tooth. DIAGNOSIS  Most of the time, dental caries is detected during a regular dental checkup. A diagnosis is made after a thorough medical and dental history is taken and the surfaces of your teeth are checked for signs of dental caries. Sometimes special instruments, such as lasers, are used to check for dental caries. Dental X-ray exams may be taken so that areas not visible to the eye (such as between the contact areas of the teeth) can be checked for cavities.  TREATMENT  If dental caries is in its early stages, it may be reversed with a fluoride treatment or an application of a remineralizing agent at the dental office. Thorough brushing and flossing at home is needed to aid these treatments. If it is in its later stages, treatment depends on the location and extent of tooth destruction:   If a small area of the tooth has been destroyed, the destroyed area will be removed and cavities will be  filled with a material such as gold, silver amalgam, or composite resin.   If a large area of the tooth has been destroyed, the destroyed area will be removed and a cap (crown) will be fitted over the remaining tooth structure.   If the center part of the tooth (pulp) is affected, a procedure called a root canal will be needed before a filling or crown can be placed.   If most of the tooth has been destroyed, the tooth may need to be pulled (extracted). HOME CARE  INSTRUCTIONS You can prevent, stop, or reverse dental caries at home by practicing good oral hygiene. Good oral hygiene includes:  Thoroughly cleaning your teeth at least twice a day with a toothbrush and dental floss.   Using a fluoride toothpaste. A fluoride mouth rinse may also be used if recommended by your dentist or health care provider.   Restricting the amount of sugary and starchy foods and sugary liquids you consume.   Avoiding frequent snacking on these foods and sipping of these liquids.   Keeping regular visits with a dentist for checkups and cleanings. PREVENTION   Practice good oral hygiene.  Consider a dental sealant. A dental sealant is a coating material that is applied by your dentist to the pits and grooves of teeth. The sealant prevents food from being trapped in them. It may protect the teeth for several years.  Ask about fluoride supplements if you live in a community without fluorinated water or with water that has a low fluoride content. Use fluoride supplements as directed by your dentist or health care provider.  Allow fluoride varnish applications to teeth if directed by your dentist or health care provider. Document Released: 09/02/2002 Document Revised: 08/13/2013 Document Reviewed: 12/13/2012 [facility] Patient Information 2014 [place]Care, [place]LL[place].

## 2014-02-22 NOTE — ED Notes (Signed)
Pt reports generalized dental pain that started today reports PMH of caries, broken teeth, poor dental hygiene and dental abscesses. States unable to see dentist. Reports taking Aspirin at home with no effect noted.

## 2014-02-25 NOTE — ED Provider Notes (Signed)
Medical screening examination/treatment/procedure(s) were performed by non-physician practitioner and as supervising physician I was immediately available for consultation/collaboration.   EKG Interpretation None        Jammy Plotkin H Jream Broyles, MD 02/25/14 0721 

## 2014-03-16 ENCOUNTER — Emergency Department (HOSPITAL_BASED_OUTPATIENT_CLINIC_OR_DEPARTMENT_OTHER)
Admission: EM | Admit: 2014-03-16 | Discharge: 2014-03-16 | Disposition: A | Discharge status: Home / Self Care | Payer: Medicaid Other | Attending: Emergency Medicine | Admitting: Emergency Medicine

## 2014-03-16 ENCOUNTER — Encounter (HOSPITAL_BASED_OUTPATIENT_CLINIC_OR_DEPARTMENT_OTHER): Payer: Self-pay | Admitting: Emergency Medicine

## 2014-03-16 DIAGNOSIS — K029 Dental caries, unspecified: Secondary | ICD-10-CM | POA: Insufficient documentation

## 2014-03-16 DIAGNOSIS — F17200 Nicotine dependence, unspecified, uncomplicated: Secondary | ICD-10-CM | POA: Insufficient documentation

## 2014-03-16 DIAGNOSIS — Z791 Long term (current) use of non-steroidal anti-inflammatories (NSAID): Secondary | ICD-10-CM | POA: Insufficient documentation

## 2014-03-16 DIAGNOSIS — Z8709 Personal history of other diseases of the respiratory system: Secondary | ICD-10-CM | POA: Insufficient documentation

## 2014-03-16 DIAGNOSIS — K089 Disorder of teeth and supporting structures, unspecified: Secondary | ICD-10-CM | POA: Insufficient documentation

## 2014-03-16 DIAGNOSIS — Z7982 Long term (current) use of aspirin: Secondary | ICD-10-CM | POA: Insufficient documentation

## 2014-03-16 MED ORDER — CLINDAMYCIN HCL 150 MG PO CAPS: 150.0000 mg | ORAL_CAPSULE | Freq: Four times a day (QID) | ORAL | Status: DC

## 2014-03-16 MED ORDER — TRAMADOL HCL 50 MG PO TABS: 50.0000 mg | ORAL_TABLET | Freq: Four times a day (QID) | ORAL | Status: DC | PRN

## 2014-03-16 NOTE — Discharge Instructions (Signed)
Dental Caries °Dental caries is tooth decay. This decay can cause a hole in teeth (cavity) that can get bigger and deeper over time. °HOME CARE °· Brush and floss your teeth. Do this at least two times a day. °· Use a fluoride toothpaste. °· Use a mouth rinse if told by your dentist or doctor. °· Eat less sugary and starchy foods. Drink less sugary drinks. °· Avoid snacking often on sugary and starchy foods. Avoid sipping often on sugary drinks. °· Keep regular checkups and cleanings with your dentist. °· Use fluoride supplements if told by your dentist or doctor. °· Allow fluoride to be applied to teeth if told by your dentist or doctor. °MAKE SURE YOU: °· Understand these instructions. °· Will watch your condition. °· Will get help right away if you are not doing well or get worse. °Document Released: 09/19/2008 Document Revised: 08/13/2013 Document Reviewed: 12/13/2012 °ExitCare® Patient Information ©2014 [employer] LLC. ° °Dental Pain °A tooth ache may be caused by cavities (tooth decay). Cavities expose the nerve of the tooth to air and hot or cold temperatures. It may come from an infection or abscess (also called a boil or furuncle) around your tooth. It is also often caused by dental caries (tooth decay). This causes the pain you are having. °DIAGNOSIS  °Your caregiver can diagnose this problem by exam. °TREATMENT  °· If caused by an infection, it may be treated with medications which kill germs (antibiotics) and pain medications as prescribed by your caregiver. Take medications as directed. °· Only take over-the-counter or prescription medicines for pain, discomfort, or fever as directed by your caregiver. °· Whether the tooth ache today is caused by infection or dental disease, you should see your dentist as soon as possible for further care. °SEEK MEDICAL CARE IF: °The exam and treatment you received today has been provided on an emergency basis only. This is not a substitute for complete medical or dental  care. If your problem worsens or new problems (symptoms) appear, and you are unable to meet with your dentist, call or return to this location. °SEEK IMMEDIATE MEDICAL CARE IF:  °· You have a fever. °· You develop redness and swelling of your face, jaw, or neck. °· You are unable to open your mouth. °· You have severe pain uncontrolled by pain medicine. °MAKE SURE YOU:  °· Understand these instructions. °· Will watch your condition. °· Will get help right away if you are not doing well or get worse. °Document Released: 12/11/2005 Document Revised: 03/04/2012 Document Reviewed: 07/29/2008 °ExitCare® Patient Information ©2014 [employer] LLC. ° °

## 2014-03-16 NOTE — ED Notes (Signed)
Pt with extensive dental decay and pain on left upper and lower.

## 2014-03-16 NOTE — ED Provider Notes (Signed)
  Medical screening examination/treatment/procedure(s) were performed by non-physician practitioner and as supervising physician I was immediately available for consultation/collaboration.   EKG Interpretation None         Makayla Confer, MD 03/16/14 2348 

## 2014-03-16 NOTE — ED Provider Notes (Signed)
CSN: [id number]     Arrival date & time 03/16/14  1708 History   First MD Initiated Contact with Patient 03/16/14 1709     Chief Complaint  Patient presents with  . Dental Pain     (Consider location/radiation/quality/duration/timing/severity/associated sxs/prior Treatment) Patient is a 23 y.o. male presenting with tooth pain. The history is provided by the patient.  Dental Pain Location:  Lower Lower teeth location:  18/LL 2nd molar Quality:  Throbbing Severity:  Moderate Onset quality:  Gradual Duration:  2 days Timing:  Constant Progression:  Worsening Chronicity:  Recurrent Context: dental caries   Worsened by:  Jaw movement, pressure, touching and cold food/drink Ineffective treatments:  None tried Risk factors: smoking     Past Medical History  Diagnosis Date  . Bronchitis    History reviewed. No pertinent past surgical history. No family history on file. History  Substance Use Topics  . Smoking status: Current Every Day Smoker -- 1.00 packs/day    Types: Cigarettes  . Smokeless tobacco: Not on file  . Alcohol Use: Yes    Review of Systems Negative except as stated in HPI   Allergies  Review of patient's allergies indicates no known allergies.  Home Medications   Current Outpatient Rx  Name  Route  Sig  Dispense  Refill  . aspirin 325 MG tablet   Oral   Take 325 mg by mouth daily.         Marland Kitchen. amoxicillin (AMOXIL) 500 MG capsule   Oral   Take 1 capsule (500 mg total) by mouth 3 (three) times daily.   21 capsule   0   . HYDROcodone-acetaminophen (NORCO/VICODIN) 5-325 MG per tablet   Oral   Take 2 tablets by mouth every 4 (four) hours as needed.   10 tablet   0   . naproxen (NAPROSYN) 500 MG tablet   Oral   Take 1 tablet (500 mg total) by mouth 2 (two) times daily.   30 tablet   0   . traMADol (ULTRAM) 50 MG tablet   Oral   Take 1 tablet (50 mg total) by mouth every 6 (six) hours as needed for pain.   15 tablet   0    BP 143/93   Pulse 75  Temp(Src) 98.7 F (37.1 C) (Oral)  Resp 19  Ht 5\' 6"  (1.676 m)  Wt 170 lb (77.111 kg)  BMI 27.45 kg/m2  SpO2 99% Physical Exam  Nursing note and vitals reviewed. Constitutional: He is oriented to person, place, and time. He appears well-developed and well-nourished. No distress.  HENT:  Head: Normocephalic.  Right Ear: External ear normal.  Left Ear: External ear normal.  Mouth/Throat: Uvula is midline, oropharynx is clear and moist and mucous membranes are normal.    Multiple dental caries and gum swelling and tenderness.   Eyes: Conjunctivae and EOM are normal.  Neck: Normal range of motion. Neck supple.  Cardiovascular: Normal rate and regular rhythm.   Pulmonary/Chest: Effort normal and breath sounds normal.  Musculoskeletal: Normal range of motion.  Lymphadenopathy:    He has no cervical adenopathy.  Neurological: He is alert and oriented to person, place, and time. No cranial nerve deficit.  Skin: Skin is warm and dry.  Psychiatric: He has a normal mood and affect. His behavior is normal.    ED Course  Procedures   MDM  23 y.o. male with dental pain due to caries. Will treat with antibiotics and pain management. He has been given  the information on the dental clinic and will call to make an appointment. Discussed with the patient and all questioned fully answered.   Medication List    STOP taking these medications       amoxicillin 500 MG capsule  Commonly known as:  AMOXIL     HYDROcodone-acetaminophen 5-325 MG per tablet  Commonly known as:  NORCO/VICODIN      TAKE these medications       clindamycin 150 MG capsule  Commonly known as:  CLEOCIN  Take 1 capsule (150 mg total) by mouth every 6 (six) hours.     traMADol 50 MG tablet  Commonly known as:  ULTRAM  Take 1 tablet (50 mg total) by mouth every 6 (six) hours as needed.      ASK your doctor about these medications       aspirin 325 MG tablet  Take 325 mg by mouth daily.      naproxen 500 MG tablet  Commonly known as:  NAPROSYN  Take 1 tablet (500 mg total) by mouth 2 (two) times daily.           [place], [place] 03/16/14 [phone]

## 2014-05-18 ENCOUNTER — Emergency Department (HOSPITAL_BASED_OUTPATIENT_CLINIC_OR_DEPARTMENT_OTHER)
Admission: EM | Admit: 2014-05-18 | Discharge: 2014-05-18 | Disposition: A | Discharge status: Home / Self Care | Payer: Medicaid Other | Attending: Emergency Medicine | Admitting: Emergency Medicine

## 2014-05-18 ENCOUNTER — Encounter (HOSPITAL_BASED_OUTPATIENT_CLINIC_OR_DEPARTMENT_OTHER): Payer: Self-pay | Admitting: Emergency Medicine

## 2014-05-18 DIAGNOSIS — Z7982 Long term (current) use of aspirin: Secondary | ICD-10-CM | POA: Insufficient documentation

## 2014-05-18 DIAGNOSIS — K089 Disorder of teeth and supporting structures, unspecified: Secondary | ICD-10-CM | POA: Insufficient documentation

## 2014-05-18 DIAGNOSIS — Z8709 Personal history of other diseases of the respiratory system: Secondary | ICD-10-CM | POA: Insufficient documentation

## 2014-05-18 DIAGNOSIS — F17200 Nicotine dependence, unspecified, uncomplicated: Secondary | ICD-10-CM | POA: Insufficient documentation

## 2014-05-18 DIAGNOSIS — K0889 Other specified disorders of teeth and supporting structures: Secondary | ICD-10-CM

## 2014-05-18 DIAGNOSIS — Z791 Long term (current) use of non-steroidal anti-inflammatories (NSAID): Secondary | ICD-10-CM | POA: Insufficient documentation

## 2014-05-18 MED ORDER — NAPROXEN 500 MG PO TABS: 500.0000 mg | ORAL_TABLET | Freq: Two times a day (BID) | ORAL | Status: DC

## 2014-05-18 MED ORDER — CLINDAMYCIN HCL 150 MG PO CAPS: 150.0000 mg | ORAL_CAPSULE | Freq: Four times a day (QID) | ORAL | Status: DC

## 2014-05-18 MED ORDER — TRAMADOL HCL 50 MG PO TABS: 50.0000 mg | ORAL_TABLET | Freq: Four times a day (QID) | ORAL | Status: DC | PRN

## 2014-05-18 NOTE — Discharge Instructions (Signed)
Dental Pain A tooth ache may be caused by cavities (tooth decay). Cavities expose the nerve of the tooth to air and hot or cold temperatures. It may come from an infection or abscess (also called a boil or furuncle) around your tooth. It is also often caused by dental caries (tooth decay). This causes the pain you are having. DIAGNOSIS  Your caregiver can diagnose this problem by exam. TREATMENT   If caused by an infection, it may be treated with medications which kill germs (antibiotics) and pain medications as prescribed by your caregiver. Take medications as directed.  Only take over-the-counter or prescription medicines for pain, discomfort, or fever as directed by your caregiver.  Whether the tooth ache today is caused by infection or dental disease, you should see your dentist as soon as possible for further care. SEEK MEDICAL CARE IF: The exam and treatment you received today has been provided on an emergency basis only. This is not a substitute for complete medical or dental care. If your problem worsens or new problems (symptoms) appear, and you are unable to meet with your dentist, call or return to this location. SEEK IMMEDIATE MEDICAL CARE IF:   You have a fever.  You develop redness and swelling of your face, jaw, or neck.  You are unable to open your mouth.  You have severe pain uncontrolled by pain medicine. MAKE SURE YOU:   Understand these instructions.  Will watch your condition.  Will get help right away if you are not doing well or get worse. Document Released: 12/11/2005 Document Revised: 03/04/2012 Document Reviewed: 07/29/2008 [facility] Patient Information 2014 [place], [place].  Dental Pain A tooth ache may be caused by cavities (tooth decay). Cavities expose the nerve of the tooth to air and hot or cold temperatures. It may come from an infection or abscess (also called a boil or furuncle) around your tooth. It is also often caused by dental caries (tooth  decay). This causes the pain you are having. DIAGNOSIS  Your caregiver can diagnose this problem by exam. TREATMENT   If caused by an infection, it may be treated with medications which kill germs (antibiotics) and pain medications as prescribed by your caregiver. Take medications as directed.  Only take over-the-counter or prescription medicines for pain, discomfort, or fever as directed by your caregiver.  Whether the tooth ache today is caused by infection or dental disease, you should see your dentist as soon as possible for further care. SEEK MEDICAL CARE IF: The exam and treatment you received today has been provided on an emergency basis only. This is not a substitute for complete medical or dental care. If your problem worsens or new problems (symptoms) appear, and you are unable to meet with your dentist, call or return to this location. SEEK IMMEDIATE MEDICAL CARE IF:   You have a fever.  You develop redness and swelling of your face, jaw, or neck.  You are unable to open your mouth.  You have severe pain uncontrolled by pain medicine. MAKE SURE YOU:   Understand these instructions.  Will watch your condition.  Will get help right away if you are not doing well or get worse. Document Released: 12/11/2005 Document Revised: 03/04/2012 Document Reviewed: 07/29/2008 [facility] Patient Information 2014 [place], [place].

## 2014-05-18 NOTE — ED Notes (Signed)
Dental pain for several weeks.  Has multiple dental problems.

## 2014-05-18 NOTE — ED Provider Notes (Signed)
CSN: [id number]     Arrival date & time 05/18/14  1240 History   First MD Initiated Contact with Patient 05/18/14 1348     Chief Complaint  Patient presents with  . Dental Pain     (Consider location/radiation/quality/duration/timing/severity/associated sxs/prior Treatment) Patient is a 23 y.o. male presenting with tooth pain. The history is provided by the patient. No language interpreter was used.  Dental Pain Location:  Upper Quality:  Shooting and constant Severity:  Moderate Onset quality:  Gradual Timing:  Constant Progression:  Worsening Chronicity:  New Relieved by:  Nothing Worsened by:  Nothing tried Associated symptoms: oral lesions     Past Medical History  Diagnosis Date  . Bronchitis    History reviewed. No pertinent past surgical history. No family history on file. History  Substance Use Topics  . Smoking status: Current Every Day Smoker -- 1.00 packs/day    Types: Cigarettes  . Smokeless tobacco: Not on file  . Alcohol Use: Yes    Review of Systems  HENT: Positive for mouth sores.   All other systems reviewed and are negative.     Allergies  Review of patient's allergies indicates no known allergies.  Home Medications   Prior to Admission medications   Medication Sig Start Date End Date Taking? Authorizing Provider  aspirin 325 MG tablet Take 325 mg by mouth daily.    Historical Provider, MD  clindamycin (CLEOCIN) 150 MG capsule Take 1 capsule (150 mg total) by mouth every 6 (six) hours. 03/16/14   Hope Orlene Och, NP  naproxen (NAPROSYN) 500 MG tablet Take 1 tablet (500 mg total) by mouth 2 (two) times daily. 02/22/14   Hope Orlene Och, NP  traMADol (ULTRAM) 50 MG tablet Take 1 tablet (50 mg total) by mouth every 6 (six) hours as needed. 03/16/14   Hope Orlene Och, NP   BP 138/98  Pulse 60  Temp(Src) 98.8 F (37.1 C) (Oral)  Resp 18  Ht 5\' 6"  (1.676 m)  Wt 165 lb (74.844 kg)  BMI 26.64 kg/m2  SpO2 100% Physical Exam  Nursing note and vitals  reviewed. Constitutional: He is oriented to person, place, and time. He appears well-developed and well-nourished.  HENT:  Head: Normocephalic.  Right Ear: External ear normal.  Left Ear: External ear normal.  Pain upper mouth,   Eyes: EOM are normal. Pupils are equal, round, and reactive to light.  Neck: Normal range of motion.  Pulmonary/Chest: Effort normal.  Musculoskeletal: Normal range of motion.  Neurological: He is alert and oriented to person, place, and time.  Psychiatric: He has a normal mood and affect.    ED Course  Procedures (including critical care time) Labs Review Labs Reviewed - No data to display  Imaging Review No results found.   EKG Interpretation None      MDM   Final diagnoses:  Tooth pain     Clindamycin Tramadol naprosyn  Elson Areas, PA-C 05/18/14 1414

## 2014-05-20 NOTE — ED Provider Notes (Signed)
Medical screening examination/treatment/procedure(s) were performed by non-physician practitioner and as supervising physician I was immediately available for consultation/collaboration.   EKG Interpretation None        Kaleb Sek, MD 05/20/14 1534 

## 2014-05-27 ENCOUNTER — Emergency Department (HOSPITAL_COMMUNITY)
Admission: EM | Admit: 2014-05-27 | Discharge: 2014-05-28 | Disposition: A | Discharge status: Other Acute Inpatient Hospital | Payer: Medicaid Other | Attending: Emergency Medicine | Admitting: Emergency Medicine

## 2014-05-27 ENCOUNTER — Encounter (HOSPITAL_COMMUNITY): Payer: Self-pay | Admitting: Emergency Medicine

## 2014-05-27 DIAGNOSIS — F10239 Alcohol dependence with withdrawal, unspecified: Secondary | ICD-10-CM | POA: Insufficient documentation

## 2014-05-27 DIAGNOSIS — Z791 Long term (current) use of non-steroidal anti-inflammatories (NSAID): Secondary | ICD-10-CM | POA: Insufficient documentation

## 2014-05-27 DIAGNOSIS — F17200 Nicotine dependence, unspecified, uncomplicated: Secondary | ICD-10-CM | POA: Insufficient documentation

## 2014-05-27 DIAGNOSIS — R51 Headache: Secondary | ICD-10-CM | POA: Insufficient documentation

## 2014-05-27 DIAGNOSIS — Z8709 Personal history of other diseases of the respiratory system: Secondary | ICD-10-CM | POA: Insufficient documentation

## 2014-05-27 DIAGNOSIS — F1110 Opioid abuse, uncomplicated: Secondary | ICD-10-CM | POA: Insufficient documentation

## 2014-05-27 DIAGNOSIS — F1010 Alcohol abuse, uncomplicated: Secondary | ICD-10-CM | POA: Insufficient documentation

## 2014-05-27 DIAGNOSIS — F10939 Alcohol use, unspecified with withdrawal, unspecified: Secondary | ICD-10-CM | POA: Insufficient documentation

## 2014-05-27 DIAGNOSIS — R638 Other symptoms and signs concerning food and fluid intake: Secondary | ICD-10-CM | POA: Insufficient documentation

## 2014-05-27 DIAGNOSIS — R110 Nausea: Secondary | ICD-10-CM | POA: Insufficient documentation

## 2014-05-27 DIAGNOSIS — Z79899 Other long term (current) drug therapy: Secondary | ICD-10-CM | POA: Insufficient documentation

## 2014-05-27 DIAGNOSIS — Z7982 Long term (current) use of aspirin: Secondary | ICD-10-CM | POA: Insufficient documentation

## 2014-05-27 LAB — COMPREHENSIVE METABOLIC PANEL
ALBUMIN: 4.2 g/dL (ref 3.5–5.2)
ALT: 70 U/L — ABNORMAL HIGH (ref 0–53)
AST: 116 U/L — ABNORMAL HIGH (ref 0–37)
Alkaline Phosphatase: 51 U/L (ref 39–117)
BILIRUBIN TOTAL: 0.6 mg/dL (ref 0.3–1.2)
BUN: 6 mg/dL (ref 6–23)
CHLORIDE: 100 meq/L (ref 96–112)
CO2: 26 mEq/L (ref 19–32)
CREATININE: 0.82 mg/dL (ref 0.50–1.35)
Calcium: 9.6 mg/dL (ref 8.4–10.5)
GFR calc Af Amer: >90 mL/min (ref >90)
Glucose, Bld: 86 mg/dL (ref 70–99)
Potassium: 3.8 mEq/L (ref 3.7–5.3)
Sodium: 142 mEq/L (ref 137–147)
Total Protein: 7.4 g/dL (ref 6.0–8.3)

## 2014-05-27 LAB — RAPID URINE DRUG SCREEN, HOSP PERFORMED
Amphetamines: NOT DETECTED
Barbiturates: NOT DETECTED
Benzodiazepines: NOT DETECTED
Cocaine: NOT DETECTED
Opiates: POSITIVE — AB
TETRAHYDROCANNABINOL: NOT DETECTED

## 2014-05-27 LAB — SALICYLATE LEVEL: Salicylate Lvl: <2 mg/dL — ABNORMAL LOW (ref 2.8–20.0)

## 2014-05-27 LAB — ETHANOL: Alcohol, Ethyl (B): 69 mg/dL — ABNORMAL HIGH (ref 0–11)

## 2014-05-27 LAB — CBC
HEMATOCRIT: 47.9 % (ref 39.0–52.0)
Hemoglobin: 18.1 g/dL — ABNORMAL HIGH (ref 13.0–17.0)
MCH: 33.1 pg (ref 26.0–34.0)
MCHC: 37.8 g/dL — ABNORMAL HIGH (ref 30.0–36.0)
MCV: 87.6 fL (ref 78.0–100.0)
Platelets: 157 10*3/uL (ref 150–400)
RBC: 5.47 MIL/uL (ref 4.22–5.81)
RDW: 12.5 % (ref 11.5–15.5)
WBC: 4.2 10*3/uL (ref 4.0–10.5)

## 2014-05-27 LAB — LIPASE, BLOOD: Lipase: 25 U/L (ref 11–59)

## 2014-05-27 LAB — ACETAMINOPHEN LEVEL: Acetaminophen (Tylenol), Serum: <15 ug/mL (ref 10–30)

## 2014-05-27 MED ORDER — VITAMIN B-1 100 MG PO TABS
100.0000 mg | ORAL_TABLET | Freq: Every day | ORAL | Status: DC
  Administered 2014-05-27 – 2014-05-28 (×2): 100 mg via ORAL
  Filled 2014-05-27 (×2): qty 1

## 2014-05-27 MED ORDER — LORAZEPAM 1 MG PO TABS
0.0000 mg | ORAL_TABLET | Freq: Four times a day (QID) | ORAL | Status: DC
  Administered 2014-05-27 – 2014-05-28 (×2): 1 mg via ORAL
  Filled 2014-05-27 (×2): qty 1

## 2014-05-27 MED ORDER — LORAZEPAM 1 MG PO TABS: 0.0000 mg | ORAL_TABLET | Freq: Two times a day (BID) | ORAL | Status: DC

## 2014-05-27 MED ORDER — THIAMINE HCL 100 MG/ML IJ SOLN: 100.0000 mg | Freq: Every day | INTRAMUSCULAR | Status: DC

## 2014-05-27 MED ORDER — ONDANSETRON 4 MG PO TBDP
4.0000 mg | ORAL_TABLET | Freq: Once | ORAL | Status: AC
  Administered 2014-05-27: 4 mg via ORAL
  Filled 2014-05-27: qty 1

## 2014-05-27 MED ORDER — NICOTINE 21 MG/24HR TD PT24
21.0000 mg | MEDICATED_PATCH | Freq: Every day | TRANSDERMAL | Status: DC
  Administered 2014-05-27 – 2014-05-28 (×2): 21 mg via TRANSDERMAL
  Filled 2014-05-27 (×2): qty 1

## 2014-05-27 NOTE — BH Assessment (Signed)
Tele Assessment Note   Frank Salinas is an 23 y.o. male. Married, Caucasian who presents [facility] accompanied by his wife, who participated in assessment with Pt's consent. Pt is requesting treatment for alcohol dependence. Pt reports he is seeking treatment at this time because "I am tired of feeling bad and waking up sick. I've thought about this a lot and I am ready to stop." Pt reports he started drinking alcohol at age 23 and has been drinking approximately a fifth of liquor daily for the past year. He states his longest period of sobriety is "about eight hours." He reports withdrawal symptoms when he stops drinking including tremor, nausea, vomiting, sweats and general body aches. He reports a history of blackout and denies any history of seizures. He denies any other substance abuse and says his urine drug screen is positive because he was prescribed pain medication for a broken tooth. He reports there is a family history of alcoholism on both side of his family. He has never sought treatment before and has no history of inpatient or outpatient mental health or substance abuse treatment.  Pt denies any specific stressors. 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 He denies any depressive symptoms. He denies any problems with sleep or appetite. He denies any problems with anxiety unless he is experiencing alcohol withdrawal. He denies suicidal ideation or history of suicidal gestures. He denies homicidal ideation or history of violence. He denies psychotic symptoms. Pt reports he lives with his wife, two children ages six and four, and other relatives. He states he has good sober supports.  Pt is dressed in hospital scrubs, alert, oriented x4 with normal speech and normal motor behavior. Eye contact is good. Pt's mood is guilty and affect is congruent with mood. Thought process is coherent and relevant. There is no indication Pt is currently  responding to internal stimuli or experiencing delusional thought content. Pt was calm and cooperative throughout assessment and appears motivated for treatment. He agrees to voluntary admission to an appropriate detox facility.   Axis I: 303.90 Alcohol Use Disorder, Severe Axis II: Deferred Axis III:  Past Medical History  Diagnosis Date  . Bronchitis    Axis IV: occupational problems Axis V: GAF=35  Past Medical History:  Past Medical History  Diagnosis Date  . Bronchitis     History reviewed. No pertinent past surgical history.  Family History: No family history on file.  Social History:  reports that he has been smoking Cigarettes.  He has been smoking about 1.00 pack per day. He does not have any smokeless tobacco history on file. He reports that he drinks alcohol. He reports that he does not use illicit drugs.  Additional Social History:  Alcohol / Drug Use Pain Medications: Denies abuse Prescriptions: Denies abuse Over the Counter: Denies abuse History of alcohol / drug use?: Yes Longest period of sobriety (when/how long): "Eight hours" Negative Consequences of Use: Personal relationships;Work / Programmer, multimediach[employer] Withdrawal Symptoms: Blackouts;Tremors;Sweats;Nausea / Vomiting Substance #1 Name of Substance 1: Alcohol 1 - Age of First Use: 15 1 - Amount (size/oz): One fifth of liquor 1 - Frequency: Daily 1 - Duration: One year on a daily basis 1 - Last Use / Amount: 05/27/14, "a little less than a fifth"  CIWA: CIWA-Ar BP: 148/100 mmHg Pulse Rate: 74 Nausea and Vomiting: 3 Tactile Disturbances: none Tremor: not visible, but can be felt fingertip to fingertip Auditory Disturbances: not present Paroxysmal Sweats:  barely perceptible sweating, palms moist Visual Disturbances: not present Anxiety: mildly anxious Headache, Fullness in Head: mild Agitation: normal activity Orientation and Clouding of Sensorium: oriented and can do serial additions CIWA-Ar Total: 8 COWS:     Allergies: No Known Allergies  Home Medications:  (Not in a hospital admission)  OB/GYN Status:  No LMP for male patient.  General Assessment Data Location of Assessment: [facility] ED Is this a Tele or Face-to-Face Assessment?: Tele Assessment Is this an Initial Assessment or a Re-assessment for this encounter?: Initial Assessment Living Arrangements: Spouse/significant other;Children;Other relatives Can pt return to current living arrangement?: Yes Admission Status: Voluntary Is patient capable of signing voluntary admission?: Yes Transfer from: Home Referral Source: Self/Family/Friend     [facility] Crisis Care Plan Living Arrangements: Spouse/significant other;Children;Other relatives Name of Psychiatrist: None Name of Therapist: None  Education Status Is patient currently in school?: No Current Grade: NA Highest grade of school patient has completed: NA Name of school: NA Contact person: NA  Risk to self Suicidal Ideation: No Suicidal Intent: No Is patient at risk for suicide?: No Suicidal Plan?: No Access to Means: No What has been your use of drugs/alcohol within the last 12 months?: Pt drinking heavily daily Previous Attempts/Gestures: No How many times?: 0 Other Self Harm Risks: None Triggers for Past Attempts: None known Intentional Self Injurious Behavior: None Family Suicide History: No;See progress notes Recent stressful life event(s): Other (Comment) (Job stress) Persecutory voices/beliefs?: No Depression: No Depression Symptoms: Guilt Substance abuse history and/or treatment for substance abuse?: Yes Suicide prevention information given to non-admitted patients: Not applicable  Risk to Others Homicidal Ideation: No Thoughts of Harm to Others: No Current Homicidal Intent: No Current Homicidal Plan: No Access to Homicidal Means: No Identified Victim: None History of harm to others?: No Assessment of Violence: None Noted Violent Behavior Description:  None Does patient have access to weapons?: Yes (Comment) (Guns are secured in the home) Criminal Charges Pending?: No Does patient have a court date: No  Psychosis Hallucinations: None noted Delusions: None noted  Mental Status Report Appear/Hygiene: In scrubs Eye Contact: Good Motor Activity: Tremors Speech: Logical/coherent Level of Consciousness: Alert Mood: Guilty Affect: Appropriate to circumstance Anxiety Level: None Thought Processes: Coherent;Relevant Judgement: Unimpaired Orientation: Person;Place;Time;Situation Obsessive Compulsive Thoughts/Behaviors: None  Cognitive Functioning Concentration: Normal Memory: Recent Intact;Remote Intact IQ: Average Insight: Good Impulse Control: Fair Appetite: Good Weight Loss: 0 Weight Gain: 0 Sleep: No Change Total Hours of Sleep: 8 Vegetative Symptoms: None  ADLScreening [facility] Assessment Services) Patient's cognitive ability adequate to safely complete daily activities?: Yes Patient able to express need for assistance with ADLs?: Yes Independently performs ADLs?: Yes (appropriate for developmental age)  Prior Inpatient Therapy Prior Inpatient Therapy: No Prior Therapy Dates: NA Prior Therapy Facilty/Provider(s): NA Reason for Treatment: NA  Prior Outpatient Therapy Prior Outpatient Therapy: No Prior Therapy Dates: NA Prior Therapy Facilty/Provider(s): NA Reason for Treatment: NA  ADL Screening (condition at time of admission) Patient's cognitive ability adequate to safely complete daily activities?: Yes Is the patient deaf or have difficulty hearing?: No Does the patient have difficulty seeing, even when wearing glasses/contacts?: No Does the patient have difficulty concentrating, remembering, or making decisions?: No Patient able to express need for assistance with ADLs?: Yes Does the patient have difficulty dressing or bathing?: No Independently performs ADLs?: Yes (appropriate for developmental age) Does the  patient have difficulty walking or climbing stairs?: No Weakness of Legs: None Weakness of Arms/Hands: None  Home Assistive Devices/Equipment Home Assistive Devices/Equipment:  None    Abuse/Neglect Assessment (Assessment to be complete while patient is alone) Physical Abuse: Denies Verbal Abuse: Denies Sexual Abuse: Denies Exploitation of patient/patient's resources: Denies Self-Neglect: Denies Values / Beliefs Cultural Requests During Hospitalization: None Spiritual Requests During Hospitalization: None   Advance Directives (For Healthcare) Advance Directive: Patient does not have advance directive;Patient would not like information Pre-existing out of facility DNR order (yellow form or pink MOST form): No Nutrition Screen- [facility] Adult/WL/AP Patient's home diet: Regular  Additional Information 1:1 In Past 12 Months?: No CIRT Risk: No Elopement Risk: No Does patient have medical clearance?: Yes     Disposition: Per Anne Fu, AC at [facility], adult unit is at capacity. Gave clinical report to Donell Sievert, PA-C who agrees Pt meets criteria for inpatient detox and accepted Pt to [facility] pending bed availability. TTS will contact other facilities for placement. Notified Blane Ohara, MD and Lavone Nian, RN of disposition.  Disposition Initial Assessment Completed for this Encounter: Yes Disposition of Patient: Inpatient treatment program Type of inpatient treatment program: Adult  Pamalee Leyden, [facility], [facility] Triage Specialist [phone]   Harlin Rain Patsy [place]. 05/27/2014 9:58 PM

## 2014-05-27 NOTE — BH Assessment (Signed)
Spoke to Blane Ohara, MD who said Pt has been drinking heavily for the past year and is requesting detox. Pt is medically cleared. Tele-assessment will be initiated.  Harlin Rain Ria Comment, [facility] Triage Specialist [phone]

## 2014-05-27 NOTE — BH Assessment (Signed)
Assessment complete. Per Anne Fu, [facility], adult unit is at capacity. Gave clinical report to Donell Sievert, PA-C who agrees Pt meets criteria for inpatient detox and accepted Pt to [facility] pending bed availability. TTS will contact other facilities for placement. Notified Blane Ohara, MD and Lavone Nian, RN of disposition.  Harlin Rain Ria Comment, [facility] Triage Specialist [phone]

## 2014-05-27 NOTE — ED Notes (Signed)
Pt placed in paper scrubs.

## 2014-05-27 NOTE — ED Notes (Signed)
Pt reports heavy drinking for appx 6 months; states he is here today for alcohol detox. Pt states he has drank appx 1/5 bourbon in last 24 hours. Denies drug use. Pt is AO x4. Denies SI/HI. Calm and cooperative.

## 2014-05-27 NOTE — ED Provider Notes (Signed)
CSN: [id number]     Arrival date & time 05/27/14  1526 History   First MD Initiated Contact with Patient 05/27/14 1558     Chief Complaint  Patient presents with  . Alcohol Problem     (Consider location/radiation/quality/duration/timing/severity/associated sxs/prior Treatment) HPI Comments: 23 year old male with alcohol abuse history for the past 6 years with daily heavy drinking for the past year. Patient is a smoker and no other known medical problems. Patient comes in with goal of detox and stopping his alcohol abuse. Patient has never gotten to this point in the past this is his first time attempting detox. He does have family support. Patient has mild nausea mild tremor and overall doesn't feel well since last drink at 8 AM this morning. No fevers chills, or other symptoms. Patient is not suicidal or homicidal. Currently nothing improves symptoms.  Patient is a 23 y.o. male presenting with alcohol problem. The history is provided by the patient.  Alcohol Problem Associated symptoms include headaches. Pertinent negatives include no chest pain, no abdominal pain and no shortness of breath.    Past Medical History  Diagnosis Date  . Bronchitis    History reviewed. No pertinent past surgical history. No family history on file. History  Substance Use Topics  . Smoking status: Current Every Day Smoker -- 1.00 packs/day    Types: Cigarettes  . Smokeless tobacco: Not on file  . Alcohol Use: Yes     Comment: drank 1/5 bourbon in last 24 hours     Review of Systems  Constitutional: Positive for appetite change. Negative for fever and chills.  HENT: Negative for congestion.   Eyes: Negative for visual disturbance.  Respiratory: Negative for shortness of breath.   Cardiovascular: Negative for chest pain.  Gastrointestinal: Positive for nausea. Negative for vomiting and abdominal pain.  Genitourinary: Negative for dysuria and flank pain.  Musculoskeletal: Positive for arthralgias.  Negative for back pain, neck pain and neck stiffness.  Skin: Negative for rash.  Neurological: Positive for headaches. Negative for syncope and light-headedness.      Allergies  Review of patient's allergies indicates no known allergies.  Home Medications   Prior to Admission medications   Medication Sig Start Date End Date Taking? Authorizing Provider  aspirin 325 MG tablet Take 325 mg by mouth daily.    Historical Provider, MD  clindamycin (CLEOCIN) 150 MG capsule Take 1 capsule (150 mg total) by mouth every 6 (six) hours. 05/18/14   Elson AreasLeslie K Sofia, PA-C  naproxen (NAPROSYN) 500 MG tablet Take 1 tablet (500 mg total) by mouth 2 (two) times daily. 05/18/14   Elson AreasLeslie K Sofia, PA-C  traMADol (ULTRAM) 50 MG tablet Take 1 tablet (50 mg total) by mouth every 6 (six) hours as needed. 05/18/14   Elson AreasLeslie K Sofia, PA-C   BP 147/92  Pulse 75  Temp(Src) 98 F (36.7 C) (Oral)  Resp 24  SpO2 97% Physical Exam  Nursing note and vitals reviewed. Constitutional: He is oriented to person, place, and time. He appears well-developed and well-nourished.  HENT:  Head: Normocephalic and atraumatic.  Eyes: Conjunctivae are normal. Right eye exhibits no discharge. Left eye exhibits no discharge.  Neck: Normal range of motion. Neck supple. No tracheal deviation present.  Cardiovascular: Normal rate and regular rhythm.   Pulmonary/Chest: Effort normal and breath sounds normal.  Abdominal: Soft. He exhibits no distension. There is no tenderness. There is no guarding.  Musculoskeletal: He exhibits no edema.  Neurological: He is alert and oriented to  person, place, and time. No cranial nerve deficit. GCS eye subscore is 4. GCS verbal subscore is 5. GCS motor subscore is 6.  No significant tremor with arms held straight.  Skin: Skin is warm. No rash noted.  Psychiatric: He has a normal mood and affect.    ED Course  Procedures (including critical care time) Labs Review Labs Reviewed  CBC - Abnormal;  Notable for the following:    Hemoglobin 18.1 (*)    MCHC 37.8 (*)    All other components within normal limits  COMPREHENSIVE METABOLIC PANEL - Abnormal; Notable for the following:    AST 116 (*)    ALT 70 (*)    All other components within normal limits  ETHANOL - Abnormal; Notable for the following:    Alcohol, Ethyl (B) 69 (*)    All other components within normal limits  SALICYLATE LEVEL - Abnormal; Notable for the following:    Salicylate Lvl <2.0 (*)    All other components within normal limits  URINE RAPID DRUG SCREEN (HOSP PERFORMED) - Abnormal; Notable for the following:    Opiates POSITIVE (*)    All other components within normal limits  ACETAMINOPHEN LEVEL  LIPASE, BLOOD    Imaging Review No results found.   EKG Interpretation None      MDM   Final diagnoses:  Alcohol abuse  Alcohol withdrawal   Patient with alcohol abuse history and interest in detox. Detox/psychiatric labs screening done in triage. Patient medically clear at this time and consult behavior health to arrange detox placement.  Discussed with behavior health and patient meets inpatient criteria. Bed placement pending. Signed out to night physician with plan.    Enid Skeens, MD 05/29/14 [phone]

## 2014-05-28 ENCOUNTER — Inpatient Hospital Stay (HOSPITAL_COMMUNITY)
Admission: AD | Admit: 2014-05-28 | Discharge: 2014-05-30 | DRG: 897 | Disposition: A | Discharge status: Home / Self Care | Payer: Federal, State, Local not specified - Other | Source: Intra-hospital | Attending: Psychiatry | Admitting: Psychiatry

## 2014-05-28 ENCOUNTER — Inpatient Hospital Stay (HOSPITAL_COMMUNITY): Admit: 2014-05-28 | Payer: Self-pay

## 2014-05-28 ENCOUNTER — Encounter (HOSPITAL_COMMUNITY): Payer: Self-pay | Admitting: *Deleted

## 2014-05-28 DIAGNOSIS — F411 Generalized anxiety disorder: Secondary | ICD-10-CM | POA: Diagnosis present

## 2014-05-28 DIAGNOSIS — F1994 Other psychoactive substance use, unspecified with psychoactive substance-induced mood disorder: Secondary | ICD-10-CM | POA: Diagnosis present

## 2014-05-28 DIAGNOSIS — F1020 Alcohol dependence, uncomplicated: Principal | ICD-10-CM | POA: Diagnosis present

## 2014-05-28 DIAGNOSIS — F332 Major depressive disorder, recurrent severe without psychotic features: Secondary | ICD-10-CM | POA: Diagnosis present

## 2014-05-28 DIAGNOSIS — F17200 Nicotine dependence, unspecified, uncomplicated: Secondary | ICD-10-CM | POA: Diagnosis present

## 2014-05-28 DIAGNOSIS — F4310 Post-traumatic stress disorder, unspecified: Secondary | ICD-10-CM | POA: Diagnosis present

## 2014-05-28 HISTORY — DX: Depression, unspecified: F32.A

## 2014-05-28 HISTORY — DX: Major depressive disorder, single episode, unspecified: F32.9

## 2014-05-28 HISTORY — DX: Anxiety disorder, unspecified: F41.9

## 2014-05-28 MED ORDER — CHLORDIAZEPOXIDE HCL 25 MG PO CAPS
25.0000 mg | ORAL_CAPSULE | Freq: Three times a day (TID) | ORAL | Status: DC
  Administered 2014-05-30 (×2): 25 mg via ORAL
  Filled 2014-05-28 (×2): qty 1

## 2014-05-28 MED ORDER — CHLORDIAZEPOXIDE HCL 25 MG PO CAPS: 25.0000 mg | ORAL_CAPSULE | ORAL | Status: DC

## 2014-05-28 MED ORDER — CLINDAMYCIN HCL 150 MG PO CAPS
150.0000 mg | ORAL_CAPSULE | Freq: Four times a day (QID) | ORAL | Status: DC
  Administered 2014-05-28 – 2014-05-30 (×7): 150 mg via ORAL
  Filled 2014-05-28 (×18): qty 1

## 2014-05-28 MED ORDER — ALUM & MAG HYDROXIDE-SIMETH 200-200-20 MG/5ML PO SUSP: 30.0000 mL | ORAL | Status: DC | PRN

## 2014-05-28 MED ORDER — TRAMADOL HCL 50 MG PO TABS
50.0000 mg | ORAL_TABLET | Freq: Four times a day (QID) | ORAL | Status: DC | PRN
  Administered 2014-05-28 – 2014-05-30 (×4): 50 mg via ORAL
  Filled 2014-05-28 (×4): qty 1

## 2014-05-28 MED ORDER — ADULT MULTIVITAMIN W/MINERALS CH
1.0000 | ORAL_TABLET | Freq: Every day | ORAL | Status: DC
  Administered 2014-05-28 – 2014-05-30 (×3): 1 via ORAL
  Filled 2014-05-28 (×6): qty 1

## 2014-05-28 MED ORDER — THIAMINE HCL 100 MG/ML IJ SOLN
100.0000 mg | Freq: Once | INTRAMUSCULAR | Status: AC
  Administered 2014-05-28: 100 mg via INTRAMUSCULAR
  Filled 2014-05-28: qty 2

## 2014-05-28 MED ORDER — MAGNESIUM HYDROXIDE 400 MG/5ML PO SUSP: 30.0000 mL | Freq: Every day | ORAL | Status: DC | PRN

## 2014-05-28 MED ORDER — VITAMIN B-1 100 MG PO TABS
100.0000 mg | ORAL_TABLET | Freq: Every day | ORAL | Status: DC
Start: 2014-05-29 — End: 2014-05-30
  Administered 2014-05-29 – 2014-05-30 (×2): 100 mg via ORAL
  Filled 2014-05-28 (×4): qty 1

## 2014-05-28 MED ORDER — CHLORDIAZEPOXIDE HCL 25 MG PO CAPS
25.0000 mg | ORAL_CAPSULE | Freq: Four times a day (QID) | ORAL | Status: AC
  Administered 2014-05-28 – 2014-05-29 (×6): 25 mg via ORAL
  Filled 2014-05-28 (×6): qty 1

## 2014-05-28 MED ORDER — LORAZEPAM 1 MG PO TABS
1.0000 mg | ORAL_TABLET | ORAL | Status: AC
  Administered 2014-05-28: 1 mg via ORAL
  Filled 2014-05-28: qty 1

## 2014-05-28 MED ORDER — NAPROXEN 500 MG PO TABS
500.0000 mg | ORAL_TABLET | Freq: Two times a day (BID) | ORAL | Status: DC
  Administered 2014-05-28 – 2014-05-30 (×4): 500 mg via ORAL
  Filled 2014-05-28 (×9): qty 1

## 2014-05-28 MED ORDER — ASPIRIN 325 MG PO TABS
325.0000 mg | ORAL_TABLET | Freq: Every day | ORAL | Status: DC
  Administered 2014-05-28 (×2): 325 mg via ORAL
  Filled 2014-05-28 (×2): qty 1

## 2014-05-28 MED ORDER — CLINDAMYCIN HCL 300 MG PO CAPS
300.0000 mg | ORAL_CAPSULE | Freq: Four times a day (QID) | ORAL | Status: DC
  Administered 2014-05-28 (×2): 300 mg via ORAL
  Filled 2014-05-28 (×6): qty 1

## 2014-05-28 MED ORDER — CHLORDIAZEPOXIDE HCL 25 MG PO CAPS: 25.0000 mg | ORAL_CAPSULE | Freq: Every day | ORAL | Status: DC

## 2014-05-28 MED ORDER — ACETAMINOPHEN 325 MG PO TABS
650.0000 mg | ORAL_TABLET | Freq: Four times a day (QID) | ORAL | Status: DC | PRN
  Administered 2014-05-29: 650 mg via ORAL
  Filled 2014-05-28: qty 2

## 2014-05-28 MED ORDER — HYDROXYZINE HCL 25 MG PO TABS
25.0000 mg | ORAL_TABLET | Freq: Four times a day (QID) | ORAL | Status: DC | PRN
  Administered 2014-05-28 – 2014-05-30 (×4): 25 mg via ORAL
  Filled 2014-05-28 (×3): qty 1
  Filled 2014-05-28: qty 10
  Filled 2014-05-28: qty 1

## 2014-05-28 MED ORDER — CHLORDIAZEPOXIDE HCL 25 MG PO CAPS
25.0000 mg | ORAL_CAPSULE | Freq: Four times a day (QID) | ORAL | Status: DC | PRN
  Administered 2014-05-30: 25 mg via ORAL
  Filled 2014-05-28: qty 1

## 2014-05-28 MED ORDER — TRAZODONE HCL 50 MG PO TABS
50.0000 mg | ORAL_TABLET | Freq: Every evening | ORAL | Status: DC | PRN
  Administered 2014-05-29: 50 mg via ORAL
  Filled 2014-05-28: qty 1
  Filled 2014-05-28: qty 14

## 2014-05-28 MED ORDER — LOPERAMIDE HCL 2 MG PO CAPS: 2.0000 mg | ORAL_CAPSULE | ORAL | Status: DC | PRN

## 2014-05-28 MED ORDER — ONDANSETRON 4 MG PO TBDP: 4.0000 mg | ORAL_TABLET | Freq: Four times a day (QID) | ORAL | Status: DC | PRN

## 2014-05-28 NOTE — BH Assessment (Signed)
Per Anne Fu, [facility], adult unit is at capacity. Gave clinical report to Donell Sievert, PA-C who agrees Pt meets criteria for inpatient detox and accepted Pt to [facility] pending bed availability. Contacted the following facilities for placement:  AT [facility]AC[facility]:  [facility]: Per Alfonso Ramus: Per [facility]: Per [facility]: Per [facility]: Per [facility]: Per [facility]: Per Chrisandra Netters: Per [facility]: Per [facility]: Per Elnita Maxwell  BED AVAILABLE. FAXED CLINICAL INFORMATION: [facility]  PT DECLINED: [facility] (due to medicaid)   Pamalee Leyden, [facility], [facility] Triage Specialist [phone]

## 2014-05-28 NOTE — [facility] Suicide Risk Assessment (Signed)
[facility] INPATIENT: Family/Significant Other Suicide Prevention Education  Suicide Prevention Education:  Education Completed; No one has been identified by the patient as the family member/significant other with whom the patient will be residing, and identified as the person(s) who will aid the patient in the event of a mental health crisis (suicidal ideations/suicide attempt).   Pt did not c/o SI at admission, nor have they endorsed SI during their stay here. SPE not required. SPI pamphlet provided to pt and he was encouraged to share information with support network, ask questions, and talk about any concerns relating to SPE.  [employer], LCSWA 05/28/2014 3:07 PM

## 2014-05-28 NOTE — Progress Notes (Addendum)
Patient ID: Frank Salinas, male   DOB: [birth year], 23 y.o.   MRN: [id number]  Patient stated this is his first psych admission.  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  Continues to have nightmares about war, certain smells bring back war memories.  Was stationed at [employer], [place], [place], [place]. [name], [place].  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  Made a dumb decision after partying with a friend.  Now he looks at life differently.  When he closes his eyes sees visions of war.  Copes with PTSD with alcohol.  Has been working with [employer].  Married with children, Frank Salinas age 22 and Frank Salinas age 52.  Stated he usually drinks all weekend 1/5 liquor nightly.  Patient's dad is an alcoholic, step mom has been clean from drugs two yrs.  Drinks alcohol because of [place] experience, started drinking alcohol at age 31 yrs, went 3 yrs with no alcohol.  Experimented with THC as a teenager.  Denied cocaine, has seen what this did to his step mother who is now clean.  Has great support system, wife and her parents and wife's brothers.  Family said that there will not be any alcohol in the house when he comes home.  Plans to stay away from his alcoholic dad.  Denied SI and HI during admission, contracts for safety.  Denied A/V hallucinations this morning.  Rated depression 1, denied hopeless, anxiety #8.  Started gritting his teeth while in [place].  Left upper and lower teeth need dental work.  Tatoos on R upper and lower arm, across chest, Right upper back and left hand ring finger tattoos.   Smokes one pack cigarettes daily.   Low fall risk, fall risk information given and discussed with patient.  Food/drink giver patient, oriented to unit. Patient has been cooperative and pleasant. Locker 31 with shoe strings.

## 2014-05-28 NOTE — Progress Notes (Signed)
Recreation Therapy Notes  Animal-Assisted Activity/Therapy (AAA/T) Program Checklist/Progress Notes Patient Eligibility Criteria Checklist & Daily Group note for Rec Tx Intervention  Date: 06.04.2015 Time: 2:45pm Location: [address] Dayroom    AAA/T Program Assumption of Risk Form signed by Patient/ or Parent Legal Guardian yes  Patient is free of allergies or sever asthma yes  Patient reports no fear of animals yes  Patient reports no history of cruelty to animals yes   Patient understands his/her participation is voluntary yes  Patient washes hands before animal contact yes  Patient washes hands after animal contact yes  Behavioral Response: Appropriate   Education: Hand Washing, Appropriate Animal Interaction   Education Outcome: Acknowledges understanding   Clinical Observations/Feedback: Patient interacted appropriately with pet therapy team and peers.  Niko Jakel L Arilyn Brierley, LRT/CTRS        Nikolette Reindl L Jamar Weatherall 05/28/2014 5:05 PM 

## 2014-05-28 NOTE — ED Notes (Signed)
Frank Salinas has arrived to transport pt to behavioral health

## 2014-05-28 NOTE — ED Notes (Signed)
CONSENTS SIGNED AND FAXED TO [facility]. PT DOES NOT HAVE ANY BELONGINGS OTHER THAN HIS TENNIS SHOES

## 2014-05-28 NOTE — Progress Notes (Signed)
D. Pt has been up and has been active and visible in milieu, attending and participating in various milieu activities. Pt spoke about his drinking problems and how he has been drinking heavily the past year. Pt spoke about how he has not had any treatment in the past and this is his first time detoxing. Pt showing some mild signs of detox this evening, and pt spoke about how he's not feeling as bad as he thought he would. Pt does state that the medication is beneficial to him and has complained of tooth pain throughout the evening and has requested and received medication to help. A. Support and encouragement provided, medication education given. R. Pt verbalized understanding, safety maintained.

## 2014-05-28 NOTE — Tx Team (Signed)
Initial Interdisciplinary Treatment Plan  PATIENT STRENGTHS: (choose at least two) Ability for insight Average or above average intelligence Communication skills General fund of knowledge Motivation for treatment/growth Physical Health Supportive family/friends Work skills  PATIENT STRESSORS: Financial difficulties Substance abuse   PROBLEM LIST: Problem List/Patient Goals Date to be addressed Date deferred Reason deferred Estimated date of resolution  Suicidal ideation 05/28/2014   D/c        Substance abuse 05/28/2014   D/c        depression 05/28/2014   D/c                           DISCHARGE CRITERIA:  Ability to meet basic life and health needs Adequate post-discharge living arrangements Improved stabilization in mood, thinking, and/or behavior Medical problems require only outpatient monitoring Motivation to continue treatment in a less acute level of care Need for constant or close observation no longer present Reduction of life-threatening or endangering symptoms to within safe limits Safe-care adequate arrangements made Verbal commitment to aftercare and medication compliance Withdrawal symptoms are absent or subacute and managed without 24-hour nursing intervention  PRELIMINARY DISCHARGE PLAN: Attend aftercare/continuing care group Attend PHP/IOP Attend 12-step recovery group Outpatient therapy Return to previous living arrangement Return to previous work or school arrangements  PATIENT/FAMIILY INVOLVEMENT: This treatment plan has been presented to and reviewed with the patient, Frank Salinas.  The patient and family have been given the opportunity to ask questions and make suggestions.  Earline Mayotte 05/28/2014, 3:51 PM

## 2014-05-28 NOTE — B[facility] Counselor (Signed)
Adult Comprehensive Assessment  Patient ID: Frank Salinas, male   DOB: 01-26-1991, 23 y.o.   MRN: [id number]  Information Source: Information source: Patient  Current Stressors:  Physical health (include injuries & life threatening diseases): none identified  Bereavement / Loss: my grandfather died on new years day of Apr 27, 2013. traumatic loss for me. he was my best friend.   Living/Environment/Situation:  Living Arrangements: Children;Spouse/significant other Living conditions (as described by patient or guardian): I live in a house with my wife and kids. we have been living together for past seven years.  How long has patient lived in current situation?: 1 year ago.  What is atmosphere in current home: Comfortable;Supportive;Loving  Family History:  Marital status: Married Number of Years Married: 7 What types of issues is patient dealing with in the relationship?: we have a great relationship. She is supportive of me getting help to stop drinking. Additional relationship information: n/a  Does patient have children?: Yes How many children?: 2 How is patient's relationship with their children?: 61 year old and 93 year old. We are very close. I love my kids so much.   Childhood History:  By whom was/is the patient raised?: Grandparents Additional childhood history information: My grandpa raised me. My father is a severe alcoholic and so is my mom. They were busy running around drinking and being stupid to raise me.  Description of patient's relationship with caregiver when they were a child: minimal relationship with parents as a child. My grandpa was my best friend. He taught me everything I know.  Patient's description of current relationship with people who raised him/her: I work with my dad but we don't have much of a relationship. I don't really talk to my mom. Grandpa died.  Does patient have siblings?: Yes Number of Siblings: 7 Description of patient's current relationship with  siblings: three half sisters and one half brother. 3 step brothers. My stepbrothers and I get along pretty well. I  dont' talk to my other brother at all. he went to jail for drug charges.  Did patient suffer any verbal/emotional/physical/sexual abuse as a child?: No Did patient suffer from severe childhood neglect?: No Has patient ever been sexually abused/assaulted/raped as an adolescent or adult?: No Was the patient ever a victim of a crime or a disaster?: No Witnessed domestic violence?: No Has patient been effected by domestic violence as an adult?: No  Education:  Highest grade of school patient has completed: I got my GED at 71. I dropped out of school  and started working.  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  How long has patient been employed?: 1 year Patient's job has been impacted by current illness: Yes Describe how patient's job has been impacted: my performance has been lackluster lately but I haven't missed work due to my dirnking. 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 ) Has patient ever served in combat?: Yes Patient description of combat service: [place] for one year. I have flashbacks, nightmares, never diagnosed with PTSD. I was self medicating. I failed a drug test and can't get set up with VA benefits.   Financial Resources:   Financial resources: Income  from employment;Food stamps Does patient have a representative payee or guardian?: No  Alcohol/Substance Abuse:   What has been your use of drugs/alcohol within the last 12 months?: 1/5 of bourban per day for about six months. Alot of stress at one time started getting at me six  months ago. No drug use identified.  If attempted suicide, did drugs/alcohol play a role in this?: No Alcohol/Substance Abuse Treatment Hx: Denies past history If yes, describe treatment: n/a  Has alcohol/substance abuse ever caused legal problems?: No  Social Support System:   Patient's Community Support System: Good Describe Community Support System: I have great friends, neighbors, and family.  Type of faith/religion: n/a  How does patient's faith help to cope with current illness?: n/a   Leisure/Recreation:   Leisure and Hobbies: spending time with my family.   Strengths/Needs:   What things does the patient do well?: Hard worker, good father and husband. dedicated to family In what areas does patient struggle / problems for patient: alcohol abuse.   Discharge Plan:   Does patient have access to transportation?: Yes Plan for no access to transportation at discharge: I dont' have a license. my wife takes me to work.  Will patient be returning to same living situation after discharge?: Yes Currently receiving community mental health services: No If no, would patient like referral for services when discharged?: Yes (What county?) Medical sales representative([place] ) Does patient have financial barriers related to discharge medications?: No (I'm about get insurance.)  Summary/Recommendations:    Pt is 23 year old male living in [place], [place] [facility] ETOH detox, mood stabilization, and med management. Pt denies SI/HI/AVH upon admission. Recommendations for pt include: therapeutic milieu, encourage group attendance, librium taper for withdrawals, medication management for moods stabilization, and development of comprehensive mental wellness/sobriety plan. Pt plans to return home at d/c and follow up at [facility] until his insurance kicks in. CSW provided pt with list of resources that accept insurance for when this occurs.   The Sherwin-WilliamsHeather Smart. LCSWA 05/28/2014

## 2014-05-28 NOTE — B[facility] Group Notes (Signed)
[facility] LCSW Group Therapy  05/28/2014 3:12 PM  Type of Therapy:  Group Therapy  Participation Level:  Did Not Attend-pt meeting with MD during group.   Draven Laine Smart LCSWA  05/28/2014, 3:12 PM

## 2014-05-28 NOTE — H&P (Signed)
Psychiatric Admission Assessment Adult  Patient Identification:  Frank Salinas Date of Evaluation:  05/28/2014 Chief Complaint:  ETOH DEPENDENCE History of Present Illness:: 23 Y/o male who states he has realized he is drinking too much. Drinks a fifth a day. States he has been drinking like that for the last 6 motnhs. In the past he was drinking half a fifth a night. States he was under stress, started to work, worries about if the money was going to be there. Things that happen at [place] started bothering him. He was D/C from the service as he tested pos for Opioids. Was in the [employer] three and a half years. Had had a hard time adjusting Elements:   Associated Signs/Synptoms: Depression Symptoms:  depressed mood, when not around his wife and kids (Hypo) Manic Symptoms:  Irritable Mood, Labiality of Mood, when drinking Anxiety Symptoms:  Excessive Worry, Panic Symptoms, Psychotic Symptoms:  none PTSD Symptoms: Had a traumatic exposure:  [place] Re-experiencing:  Flashbacks Intrusive Thoughts Total Time spent with patient: 45 minutes  Psychiatric Specialty Exam: Physical Exam  Review of Systems  Constitutional: Negative.   HENT:       Tooth ache  Eyes: Negative.   Respiratory:       Pack a day   Cardiovascular: Negative.   Gastrointestinal: Positive for diarrhea.  Genitourinary: Negative.   Musculoskeletal: Negative.   Skin: Negative.   Neurological: Positive for tremors.  Endo/Heme/Allergies: Negative.   Psychiatric/Behavioral: Positive for depression and substance abuse. The patient is nervous/anxious.     There were no vitals taken for this visit.There is no weight on file to calculate BMI.  General Appearance: Disheveled  Eye Contact::  Minimal  Speech:  Clear and Coherent and not spontaneous  Volume:  Decreased  Mood:  Anxious and Depressed, Worried  Affect:  Depressed and worried  Thought Process:  Coherent and Goal Directed  Orientation:  Full (Time, Place, and Person)   Thought Content:  events, worries, concerns  Suicidal Thoughts:  No  Homicidal Thoughts:  No  Memory:  Immediate;   Fair Recent;   Fair Remote;   Fair  Judgement:  Fair  Insight:  Present  Psychomotor Activity:  Restlessness  Concentration:  Fair  Recall:  [employer] of Knowledge:NA  Language: Fair  Akathisia:  No  Handed:    AIMS (if indicated):     Assets:  Desire for Improvement  Sleep:       Musculoskeletal: Strength & Muscle Tone: within normal limits Gait & Station: normal Patient leans: N/A  Past Psychiatric History: Diagnosis:  Hospitalizations: Denies   Outpatient Care: Denies  Substance Abuse Care: Denies   Self-Mutilation: Denies  Suicidal Attempts: Denies  Violent Behaviors: When drinking   Past Medical History:   Past Medical History  Diagnosis Date  . Bronchitis    None. Allergies:  No Known Allergies PTA Medications: Prescriptions prior to admission  Medication Sig Dispense Refill  . aspirin 325 MG tablet Take 325 mg by mouth daily.      . clindamycin (CLEOCIN) 150 MG capsule Take 150 mg by mouth every 6 (six) hours.      . naproxen (NAPROSYN) 500 MG tablet Take 1 tablet (500 mg total) by mouth 2 (two) times daily.  30 tablet  0  . traMADol (ULTRAM) 50 MG tablet Take 1 tablet (50 mg total) by mouth every 6 (six) hours as needed.  15 tablet  0    Previous Psychotropic Medications:  Medication/Dose  Wellbutrin when  he was younger               Substance Abuse History in the last 12 months:  yes  Consequences of Substance Abuse: Blackouts:   Withdrawal Symptoms:   Diaphoresis Diarrhea Headaches Nausea Tremors Vomiting  Social History:  reports that he has been smoking Cigarettes.  He has been smoking about 1.00 pack per day. He does not have any smokeless tobacco history on file. He reports that he drinks alcohol. He reports that he does not use illicit drugs. 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 Both parents alcoholics  Results for orders placed during the hospital encounter of 05/27/14 (from the past 72 hour(s))  ACETAMINOPHEN LEVEL     Status: None   Collection Time    05/27/14  3:48 PM      Result Value Ref Range   Acetaminophen (Tylenol), Serum <15.0  10 - 30 ug/mL   Comment:            THERAPEUTIC CONCENTRATIONS VARY     SIGNIFICANTLY. A RANGE OF 10-30     ug/mL MAY BE AN EFFECTIVE     CONCENTRATION FOR MANY PATIENTS.     HOWEVER, SOME ARE BEST TREATED     AT CONCENTRATIONS OUTSIDE THIS     RANGE.     ACETAMINOPHEN CONCENTRATIONS     >150 ug/mL AT 4 HOURS AFTER     INGESTION AND >50 ug/mL AT 12     HOURS AFTER INGESTION ARE     OFTEN ASSOCIATED WITH TOXIC     REACTIONS.  CBC     Status: Abnormal   Collection Time    05/27/14  3:48 PM      Result Value Ref Range   WBC 4.2  4.0 - 10.5 K/uL   RBC 5.47  4.22 - 5.81 MIL/uL   Hemoglobin 18.1 (*) 13.0 - 17.0 g/dL   HCT 47.9  39.0 - 52.0 %   MCV 87.6  78.0 - 100.0 fL   MCH 33.1  26.0 - 34.0 pg   MCHC 37.8 (*) 30.0 - 36.0 g/dL   Comment: RULED OUT INTERFERING SUBSTANCES   RDW 12.5  11.5 - 15.5 %   Platelets 157  150 - 400 K/uL  COMPREHENSIVE METABOLIC PANEL     Status: Abnormal   Collection Time    05/27/14  3:48 PM      Result Value Ref Range   Sodium 142  137 - 147 mEq/L    Potassium 3.8  3.7 - 5.3 mEq/L   Chloride 100  96 - 112 mEq/L   CO2 26  19 - 32 mEq/L   Glucose, Bld 86  70 - 99 mg/dL   BUN 6  6 - 23 mg/dL   Creatinine, Ser 0.82  0.50 - 1.35  mg/dL   Calcium 9.6  8.4 - 10.5 mg/dL   Total Protein 7.4  6.0 - 8.3 g/dL   Albumin 4.2  3.5 - 5.2 g/dL   AST 116 (*) 0 - 37 U/L   ALT 70 (*) 0 - 53 U/L   Alkaline Phosphatase 51  39 - 117 U/L   Total Bilirubin 0.6  0.3 - 1.2 mg/dL   GFR calc non Af Amer >90  >90 mL/min   GFR calc Af Amer >90  >90 mL/min   Comment: (NOTE)     The eGFR has been calculated using the CKD EPI equation.     This calculation has not been validated in all clinical situations.     eGFR's persistently <90 mL/min signify possible Chronic Kidney     Disease.  ETHANOL     Status: Abnormal   Collection Time    05/27/14  3:48 PM      Result Value Ref Range   Alcohol, Ethyl (B) 69 (*) 0 - 11 mg/dL   Comment:            LOWEST DETECTABLE LIMIT FOR     SERUM ALCOHOL IS 11 mg/dL     FOR MEDICAL PURPOSES ONLY  SALICYLATE LEVEL     Status: Abnormal   Collection Time    05/27/14  3:48 PM      Result Value Ref Range   Salicylate Lvl <3.8 (*) 2.8 - 20.0 mg/dL  URINE RAPID DRUG SCREEN (HOSP PERFORMED)     Status: Abnormal   Collection Time    05/27/14  6:35 PM      Result Value Ref Range   Opiates POSITIVE (*) NONE DETECTED   Cocaine NONE DETECTED  NONE DETECTED   Benzodiazepines NONE DETECTED  NONE DETECTED   Amphetamines NONE DETECTED  NONE DETECTED   Tetrahydrocannabinol NONE DETECTED  NONE DETECTED   Barbiturates NONE DETECTED  NONE DETECTED   Comment:            DRUG SCREEN FOR MEDICAL PURPOSES     ONLY.  IF CONFIRMATION IS NEEDED     FOR ANY PURPOSE, NOTIFY LAB     WITHIN 5 DAYS.                LOWEST DETECTABLE LIMITS     FOR URINE DRUG SCREEN     Drug Class       Cutoff (ng/mL)     Amphetamine      1000     Barbiturate      200     Benzodiazepine   937     Tricyclics       342     Opiates          300     Cocaine           300     THC              50  LIPASE, BLOOD     Status: None   Collection Time    05/27/14  9:24 PM      Result Value Ref Range   Lipase 25  11 - 59 U/L   Psychological Evaluations:  Assessment:   DSM5:  Schizophrenia Disorders:  none Obsessive-Compulsive Disorders:  none Trauma-Stressor Disorders:  Posttraumatic Stress Disorder (309.81) Substance/Addictive Disorders:  Alcohol Related Disorder - Severe (303.90) Depressive Disorders:  Major Depressive Disorder - Moderate (296.22)  AXIS I:  Substance Induced Mood Disorder AXIS II:  Deferred  AXIS III:   Past Medical History  Diagnosis Date  . Bronchitis    AXIS IV:  other psychosocial or environmental problems AXIS V:  41-50 serious symptoms  Treatment Plan/Recommendations:  Supportive approach/coping skills/relapse prevention                                                                 Librium detox                                                                  Reassess and address the co morbidities  Treatment Plan Summary: Daily contact with patient to assess and evaluate symptoms and progress in treatment Medication management Current Medications:  No current facility-administered medications for this encounter.    Observation Level/Precautions:  15 minute checks  Laboratory:  As per the ED  Psychotherapy:  Individual/group  Medications:  Librium detox/reassess for other psychotropics  Consultations:    Discharge Concerns:    Estimated LOS: 3-5 days  Other:     I certify that inpatient services furnished can reasonably be expected to improve the patient's condition.   Nicholaus Bloom 6/4/20151:25 PM

## 2014-05-28 NOTE — Progress Notes (Signed)
Pt did not attend karaoke group this evening.  

## 2014-05-28 NOTE — Progress Notes (Signed)
Per [facility] patient accepted to [facility] 300-2 admitting MD. Dub Mikes. RN on floor notified and she will send support documents.

## 2014-05-28 NOTE — ED Notes (Signed)
[facility] called for transport

## 2014-05-28 NOTE — [facility] Suicide Risk Assessment (Signed)
Suicide Risk Assessment  Admission Assessment     Nursing information obtained from:  Patient Demographic factors:  Male;Adolescent or young adult;Caucasian;Low socioeconomic status Current Mental Status:    Loss Factors:  Financial problems / change in socioeconomic status Historical Factors:  Family history of mental illness or substance abuse Risk Reduction Factors:  Responsible for children under 25 years of age;Sense of responsibility to family;Employed;Living with another person, especially a relative;Positive social support Total Time spent with patient: 45 minutes  CLINICAL FACTORS:   Alcohol/Substance Abuse/Dependencies  PSUICIDE RISK:   Moderate:   PLAN OF CARE: Supportive approach/coping skills/relapse prevention                               Librium detox/reassess and address the co morbidities  I certify that inpatient services furnished can reasonably be expected to improve the patient's condition.  Rachael Fee 05/28/2014, 6:08 PM

## 2014-05-29 MED ORDER — NICOTINE 21 MG/24HR TD PT24
21.0000 mg | MEDICATED_PATCH | Freq: Every day | TRANSDERMAL | Status: DC
  Administered 2014-05-29 – 2014-05-30 (×2): 21 mg via TRANSDERMAL
  Filled 2014-05-29 (×4): qty 1

## 2014-05-29 NOTE — Progress Notes (Signed)
Assumed care of patient at 0100. Pt observed resting in bed with eyes closed. RR WNL, even and unlabored. No acute distress. Level III obs in place for safety and pt is safe. Zaxton Angerer Eakes Chemeka Filice  

## 2014-05-29 NOTE — Tx Team (Signed)
Interdisciplinary Treatment Plan Update (Adult)  Date: 05/29/2014   Time Reviewed: 10:05 AM  Progress in Treatment:  Attending groups: Yes  Participating in groups:  Yes  Taking medication as prescribed: Yes  Tolerating medication: Yes  Family/Significant othe contact made: Not need. Pt did not endorse SI during admission/stay.   Patient understands diagnosis: Yes, AEB seeking treatment for ETOH detox and mood stabilization.  Discussing patient identified problems/goals with staff: Yes  Medical problems stabilized or resolved: Yes  Denies suicidal/homicidal ideation: Yes during admission/self report.  Patient has not harmed self or Others: Yes  New problem(s) identified:  Discharge Plan or Barriers: Pt plans to return home with family and follow up at [facility] until his insurance kicks in. CSW provided pt with [facility] and [facility] o/p info and explained how to set up followup when his insurance begins.  Additional comments: 23 Y/o male who states he has realized he is drinking too much. Drinks a fifth a day. States he has been drinking like that for the last 6 motnhs. In the past he was drinking half a fifth a night. States he was under stress, started to work, worries about if the money was going to be there. Things that happen at [place] started bothering him. He was D/C from the service as he tested pos for Opioids. Was in the [employer] three and a half years. Had had a hard time adjusting.  Reason for Continuation of Hospitalization: Librium taper-withdrawals Estimated length of stay: 1 day (scheduled d/c for Saturday). For review of initial/current patient goals, please see plan of care.  Attendees:  Patient:    Family:    Physician: Geoffery Lyons MD 05/29/2014 10:04 AM   Nursing:    Trula Slade, LCSWA 05/29/2014 10:04 AM      Other:    Other:   Other:    Scribe for Treatment Team:  Trula Slade LCSWA 05/29/2014 10:05 AM

## 2014-05-29 NOTE — [facility] Group Notes (Signed)
Adult Psychoeducational Group Note  Date:  05/29/2014 Time:  11:41 PM  Group Topic/Focus:  AA Meeting  Participation Level:  Minimal  Participation Quality:  Appropriate  Affect:  Flat  Cognitive:  Appropriate  Insight: Appropriate  Engagement in Group:  Limited  Modes of Intervention:  Discussion and Education  Additional Comments:  Other attended group.  He gave good insight into his situation.  Karlo Goeden A Lillia Abed 05/29/2014, 11:41 PM

## 2014-05-29 NOTE — Progress Notes (Signed)
Patient ID: Frank Salinas, male   DOB: 1991/11/28, 23 y.o.   MRN: [id number] D  ---  Pt. Complains of tooth ache of 8/10 and medicated.  He is friendly and calm and has good interaction with staff and appears vested in treatment,.  .   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   He maintains a clean , neat appearance and has good eye contact.  A ---  Support and meds as ordered  R  --  Pt. Remains friendly and safe on unit

## 2014-05-29 NOTE — Progress Notes (Signed)
[facility] Adult Case Management Discharge Plan :  Will you be returning to the same living situation after discharge: Yes,  return home At discharge, do you have transportation home?:Yes,  wife coming after lunch on saturday to transport pt home. Do you have the ability to pay for your medications:Yes,  mental health-pt reports that he does not have insurance or medicaid   Release of information consent forms completed and submitted to medical records by CSW.  Patient to Follow up at: Follow-up Information   Follow up with [facility]. (Walk in between 8am-9am Monday through Friday for hospital followup/medication management/assessment for therapy services. )    Contact information:   [address] [place], [place] [address] Phone: [phone] Fax: [phone]    Pt stated that he is signing up for private insurance through his job at some point. CSW provided pt with resources for med management/therapy/SA IOP that accept private insurance-in pt chart.  Patient denies SI/HI:   Yes,  during group/admission/self report.    Safety Planning and Suicide Prevention discussed:  Yes,  SPE not required for this pt. SPI pamphlet provided to pt and he was encouraged to share information with support network, ask questions, and talk about any concerns relating to SPE.  Shalina Norfolk Smart LCSWA  05/29/2014, 12:27 PM

## 2014-05-29 NOTE — [facility] Group Notes (Signed)
[facility] LCSW Group Therapy  05/29/2014 2:19 PM  Type of Therapy:  Group Therapy  Participation Level:  Active  Participation Quality:  Attentive  Affect:  Appropriate  Cognitive:  Alert and Oriented  Insight:  Engaged  Engagement in Therapy:  Engaged  Modes of Intervention:  Confrontation, Discussion, Education, Exploration, Problem-solving, Rapport Building, Socialization and Support  Summary of Progress/Problems: Feelings around Relapse. Group members discussed the meaning of relapse and shared personal stories of relapse, how it affected them and others, and how they perceived themselves during this time. Group members were encouraged to identify triggers, warning signs and coping skills used when facing the possibility of relapse. Social supports were discussed and explored in detail. Post Acute Withdrawal Syndrome (handout provided) was introduced and examined. Pt's were encouraged to ask questions, talk about key points associated with PAWS, and process this information in terms of relapse prevention. Frank Salinas was attentive and engaged throughout today's therapy session. He shared that he struggled with PAWS symptoms in the past and did not know how to handle them. Frank Salinas shows progress in the group setting and improving insight AEB his ability to acknowledge the need for a safety plan-"I am going to share this with my wife and family. I plan to spend as much time with my kids playing and taking care of them to take the focus off my symptoms. That is what will help me."    [facility] LCSWA  05/29/2014, 2:19 PM

## 2014-05-29 NOTE — Progress Notes (Signed)
D: Patient denies SI/HI and A/V hallucinations; patient reports sleep is fair; reports appetite is good ; reports energy level is normal ; reports ability to pay attention is good; patient reports that he is having a lot of anxiety  A: Monitored q 15 minutes; patient encouraged to attend groups; patient educated about medications; patient given medications per physician orders; patient encouraged to express feelings and/or concerns  R: Patient is cooperative and does have some anxiety but reports some relief after the medication; patient's interaction with staff and peers is appropriate; patient was able to set goal to talk with staff 1:1 when having feelings of SI; patient is taking medications as prescribed and tolerating medications; patient is attending all groups

## 2014-05-29 NOTE — Progress Notes (Signed)
[facility] MD Progress Note  05/29/2014 4:22 PM Frank Salinas  MRN:  [id number] Subjective:  States that he is still having thoughts about [place] as well as dreams, nigthmares. States first thing in the AM he has the experience of smelling "burned flesh." He recounts the events he was involved in [place]. 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 He was asked to leave when he had a pos UDS and failed to tell them he was using drugs. He wanted to make the [employer] a career. He admits he has not been able to get over it. He was told by a peer he can explain to the [place] what happened and get the D/C changed and request to be eligible for PTSD services. Meanwhile states that coming off alcohol has been the worst experience ever and that he is committed to abstinence. States he has a good support system behind him Diagnosis:   DSM5: Schizophrenia Disorders:  none Obsessive-Compulsive Disorders:  none Trauma-Stressor Disorders:  Posttraumatic Stress Disorder (309.81) Substance/Addictive Disorders:  Alcohol Related Disorder - Severe (303.90) Depressive Disorders:  Major Depressive Disorder - Moderate (296.22) Total Time spent with patient: 30 minutes  Axis I: Substance Induced Mood Disorder  ADL's:  Intact  Sleep: Fair  Appetite:  Fair  Suicidal Ideation:  Plan:  denies Intent:  denies Means:  denies Homicidal Ideation:  Plan:  denies Intent:  denies Means:  denies AEB (as evidenced by):  Psychiatric Specialty Exam: Physical Exam  Review of Systems  Constitutional: Negative.   HENT: Negative.   Eyes: Negative.   Respiratory: Negative.   Cardiovascular: Negative.   Gastrointestinal: Negative.   Genitourinary: Negative.   Musculoskeletal: Negative.   Skin: Negative.   Neurological: Negative.   Endo/Heme/Allergies: Negative.   Psychiatric/Behavioral: Positive for depression and substance abuse. The patient is nervous/anxious.     Blood  pressure 131/87, pulse 67, temperature 98 F (36.7 C), temperature source Oral, resp. rate 18, height 5\' 6"  (1.676 m), weight 76.658 kg (169 lb), SpO2 97.00%.Body mass index is 27.29 kg/(m^2).  General Appearance: Fairly Groomed  Patent attorney::  Fair  Speech:  Clear and Coherent  Volume:  Normal  Mood:  Anxious and sad, worried  Affect:  anxious, worried  Thought Process:  Coherent and Goal Directed  Orientation:  Full (Time, Place, and Person)  Thought Content:  worries, concerns, ruminations, symptoms  Suicidal Thoughts:  No  Homicidal Thoughts:  No  Memory:  Immediate;   Fair Recent;   Fair Remote;   Fair  Judgement:  Fair  Insight:  Present  Psychomotor Activity:  Restlessness  Concentration:  Fair  Recall:  [employer] of Knowledge:NA  Language: Fair  Akathisia:  No  Handed:    AIMS (if indicated):     Assets:  Desire for Improvement  Sleep:  Number of Hours: 6.75   Musculoskeletal: Strength & Muscle Tone: within normal limits Gait & Station: normal Patient leans: N/A  Current Medications: Current Facility-Administered Medications  Medication Dose Route Frequency Provider Last Rate Last Dose  . acetaminophen (TYLENOL) tablet 650 mg  650 mg Oral Q6H PRN Rachael Fee, MD   650 mg at 05/29/14 1253  . alum & mag hydroxide-simeth (MAALOX/MYLANTA) 200-200-20 MG/5ML suspension 30 mL  30 mL Oral Q4H PRN Rachael Fee, MD      . chlordiazePOXIDE (LIBRIUM) capsule 25 mg  25 mg Oral Q6H PRN Rachael Fee, MD      .  chlordiazePOXIDE (LIBRIUM) capsule 25 mg  25 mg Oral QID Rachael Fee, MD   25 mg at 05/29/14 1203   Followed by  . [START ON 05/30/2014] chlordiazePOXIDE (LIBRIUM) capsule 25 mg  25 mg Oral TID Rachael Fee, MD       Followed by  . [START ON 05/31/2014] chlordiazePOXIDE (LIBRIUM) capsule 25 mg  25 mg Oral BH-qamhs Rachael Fee, MD       Followed by  . [START ON 06/01/2014] chlordiazePOXIDE (LIBRIUM) capsule 25 mg  25 mg Oral Daily Rachael Fee, MD      . clindamycin  (CLEOCIN) capsule 150 mg  150 mg Oral Q6H Rachael Fee, MD   150 mg at 05/29/14 1441  . hydrOXYzine (ATARAX/VISTARIL) tablet 25 mg  25 mg Oral Q6H PRN Rachael Fee, MD   25 mg at 05/29/14 1550  . loperamide (IMODIUM) capsule 2-4 mg  2-4 mg Oral PRN Rachael Fee, MD      . magnesium hydroxide (MILK OF MAGNESIA) suspension 30 mL  30 mL Oral Daily PRN Rachael Fee, MD      . multivitamin with minerals tablet 1 tablet  1 tablet Oral Daily Rachael Fee, MD   1 tablet at 05/29/14 0753  . naproxen (NAPROSYN) tablet 500 mg  500 mg Oral BID Rachael Fee, MD   500 mg at 05/29/14 0753  . nicotine (NICODERM CQ - dosed in mg/24 hours) patch 21 mg  21 mg Transdermal Daily Rachael Fee, MD   21 mg at 05/29/14 0816  . ondansetron (ZOFRAN-ODT) disintegrating tablet 4 mg  4 mg Oral Q6H PRN Rachael Fee, MD      . thiamine (VITAMIN B-1) tablet 100 mg  100 mg Oral Daily Rachael Fee, MD   100 mg at 05/29/14 0753  . traMADol (ULTRAM) tablet 50 mg  50 mg Oral Q6H PRN Rachael Fee, MD   50 mg at 05/29/14 1610  . traZODone (DESYREL) tablet 50 mg  50 mg Oral QHS PRN Rachael Fee, MD        Lab Results:  Results for orders placed during the hospital encounter of 05/27/14 (from the past 48 hour(s))  URINE RAPID DRUG SCREEN (HOSP PERFORMED)     Status: Abnormal   Collection Time    05/27/14  6:35 PM      Result Value Ref Range   Opiates POSITIVE (*) NONE DETECTED   Cocaine NONE DETECTED  NONE DETECTED   Benzodiazepines NONE DETECTED  NONE DETECTED   Amphetamines NONE DETECTED  NONE DETECTED   Tetrahydrocannabinol NONE DETECTED  NONE DETECTED   Barbiturates NONE DETECTED  NONE DETECTED   Comment:            DRUG SCREEN FOR MEDICAL PURPOSES     ONLY.  IF CONFIRMATION IS NEEDED     FOR ANY PURPOSE, NOTIFY LAB     WITHIN 5 DAYS.                LOWEST DETECTABLE LIMITS     FOR URINE DRUG SCREEN     Drug Class       Cutoff (ng/mL)     Amphetamine      1000     Barbiturate      200     Benzodiazepine    200     Tricyclics       300     Opiates  300     Cocaine          300     THC              50  LIPASE, BLOOD     Status: None   Collection Time    05/27/14  9:24 PM      Result Value Ref Range   Lipase 25  11 - 59 U/L    Physical Findings: AIMS: Facial and Oral Movements Muscles of Facial Expression: None, normal Lips and Perioral Area: None, normal Jaw: None, normal Tongue: None, normal,Extremity Movements Upper (arms, wrists, hands, fingers): None, normal Lower (legs, knees, ankles, toes): None, normal, Trunk Movements Neck, shoulders, hips: None, normal, Overall Severity Severity of abnormal movements (highest score from questions above): None, normal Incapacitation due to abnormal movements: None, normal Patient's awareness of abnormal movements (rate only patient's report): No Awareness, Dental Status Current problems with teeth and/or dentures?: No Does patient usually wear dentures?: No  CIWA:  CIWA-Ar Total: 3 COWS:  COWS Total Score: 2  Treatment Plan Summary: Daily contact with patient to assess and evaluate symptoms and progress in treatment Medication management  Plan: Supportive approach/coping skills/relapse prevention           Continue detox/address the co morbidities           Consider D/C in the AM  Medical Decision Making Problem Points:  Review of psycho-social stressors (1) Data Points:  Review of medication regiment & side effects (2)  I certify that inpatient services furnished can reasonably be expected to improve the patient's condition.   Rachael Feerving A Rishawn Walck 05/29/2014, 4:22 PM

## 2014-05-29 NOTE — Clinical Social Work Note (Addendum)
Pt and CSW completed FMLA paperwork together with return to work date of 06/03/14. Pt requested that all diagnoses be reported on FMLA including Alcohol Related Disorder. Pt stated that he told HR and his employer that he came to hospital for detox and wanted that included on paperwork. Pt requested that this info be faxed to secure fax: -fax sent 3 times-line busy/not in service. Pt provided alternative fax number.  Pt provided with fax confirmation and copy of FMLA paperwork. Pt signed ROI authorizing this release of information. CSW faxed info at 2:18PM 05/29/14 and provided pt with confirmation.  [employer], LCSWA 05/29/2014 2:18 PM

## 2014-05-29 NOTE — [facility] Group Notes (Signed)
[facility] LCSW Aftercare Discharge Planning Group Note   05/29/2014 9:34 AM  Participation Quality:  Appropriate   Mood/Affect:  Appropriate  Depression Rating:  0  Anxiety Rating:  2  Thoughts of Suicide:  No Will you contract for safety?   NA  Current AVH:  No  Plan for Discharge/Comments:  Pt reports that he is planning to d/c tomorrow and will follow up at [facility] until his insurance becomes active. Pt plans to return home. Pt provided CSW with FMLA ppw to complete and requested that CSW fax on day of d/c (Saturday 6-5).   Transportation Means: wife  Supports: wife and kids  Teaching laboratory technician

## 2014-05-30 DIAGNOSIS — F1010 Alcohol abuse, uncomplicated: Secondary | ICD-10-CM

## 2014-05-30 DIAGNOSIS — F332 Major depressive disorder, recurrent severe without psychotic features: Secondary | ICD-10-CM

## 2014-05-30 MED ORDER — CLINDAMYCIN HCL 150 MG PO CAPS: 150.0000 mg | ORAL_CAPSULE | Freq: Four times a day (QID) | ORAL | Status: DC

## 2014-05-30 MED ORDER — NAPROXEN 500 MG PO TABS: 500.0000 mg | ORAL_TABLET | Freq: Two times a day (BID) | ORAL | Status: DC

## 2014-05-30 MED ORDER — TRAMADOL HCL 50 MG PO TABS: 50.0000 mg | ORAL_TABLET | Freq: Four times a day (QID) | ORAL | Status: DC | PRN

## 2014-05-30 MED ORDER — TRAZODONE HCL 50 MG PO TABS: 50.0000 mg | ORAL_TABLET | Freq: Every evening | ORAL | Status: DC | PRN

## 2014-05-30 MED ORDER — HYDROXYZINE HCL 25 MG PO TABS: 25.0000 mg | ORAL_TABLET | Freq: Four times a day (QID) | ORAL | Status: DC | PRN

## 2014-05-30 MED ORDER — PRAZOSIN HCL 1 MG PO CAPS: 1.0000 mg | ORAL_CAPSULE | Freq: Every day | ORAL | Status: DC

## 2014-05-30 MED ORDER — PRAZOSIN HCL 1 MG PO CAPS
1.0000 mg | ORAL_CAPSULE | Freq: Every day | ORAL | Status: DC
  Filled 2014-05-30 (×3): qty 1

## 2014-05-30 NOTE — Progress Notes (Signed)
Patient ID: Frank Salinas, male   DOB: Jun 21, 1991, 23 y.o.   MRN: [id number]  D: Pt had been appropriate on the unit today, he was very focused on discharged seeing that today is his birthday. Pt reported that he hopes he would be successful at home, because if he can not his girlfriend reported that she was leaving with the kids. Pt reported being negative SI/HI, no AH/VH noted. A: 15 min checks continued for patient safety. R: Pt safety maintained.

## 2014-05-30 NOTE — B[facility] Suicide Risk Assessment (Signed)
   Demographic Factors:  Male, Adolescent or young adult and Caucasian  Total Time spent with patient: 20 minutes  Psychiatric Specialty Exam: Physical Exam  ROS  Blood pressure 122/84, pulse 80, temperature 97.5 F (36.4 C), temperature source Oral, resp. rate 18, height 5\' 6"  (1.676 m), weight 76.658 kg (169 lb), SpO2 97.00%.Body mass index is 27.29 kg/(m^2).  General Appearance: Well Groomed  Patent attorney::  Good  Speech:  Normal Rate  Volume:  Normal  Mood:  Euthymic  Affect:  Appropriate  Thought Process:  Linear  Orientation:  Full (Time, Place, and Person)  Thought Content:  no psychotic symptoms  Suicidal Thoughts:  No  Homicidal Thoughts:  No  Memory:  NA  Judgement:  Good  Insight:  Good  Psychomotor Activity:  Normal  Concentration:  Good  Recall:  Good  Fund of Knowledge:Good  Language: Good  Akathisia:  No  Handed:  Right  AIMS (if indicated):     Assets:  Desire for Improvement Physical Health Social Support  Sleep:  Number of Hours: 6.5    Musculoskeletal: Strength & Muscle Tone: within normal limits Gait & Station: normal Patient leans: N/A   Mental Status Per Nursing Assessment::   On Admission:     Current Mental Status by Physician: at this time patient not suicidal, behavior in good control, looking forward to discharge and reuniting with his family  Loss Factors: NA  Historical Factors: Impulsivity, Alcohol dependence  Risk Reduction Factors:   Responsible for children under 35 years of age, Sense of responsibility to family, Living with another person, especially a relative and Positive social support  Continued Clinical Symptoms:  Alcohol/Substance Abuse/Dependencies  Cognitive Features That Contribute To Risk:  No cognitive issues identified at this time Suicide Risk:  Mild:  Suicidal ideation of limited frequency, intensity, duration, and specificity.  There are no identifiable plans, no associated intent, mild dysphoria and  related symptoms, good self-control (both objective and subjective assessment), few other risk factors, and identifiable protective factors, including available and accessible social support.  Discharge Diagnoses:   AXIS I:  alcohol dependence AXIS II:  Deferred AXIS III:   Past Medical History  Diagnosis Date  . Bronchitis   . Anxiety   . Depression    AXIS IV:  chronic mental illness AXIS V:  61-70 mild symptoms  Plan Of Care/Follow-up recommendations:  Activity:  as tolerated  Diet:  regular Tests:  NA Other:  follow up as outpatient for ongoing treatment. AA attednance encouraged  Is patient on multiple antipsychotic therapies at discharge:  No   Has Patient had three or more failed trials of antipsychotic monotherapy by history:  No  Recommended Plan for Multiple Antipsychotic Therapies: NA    Fernando Cobos 05/30/2014, 11:24 AM

## 2014-05-30 NOTE — Progress Notes (Signed)
Patient ID: Frank Salinas, male   DOB: 1991-06-05, 23 y.o.   MRN: [id number]  Pt was discharged home with girlfriend and children, he reported no issues or concerns at time of discharge. All discharge instructions were given, samples were also given. Pt reported being negative SI/HI, no AH/VH noted.

## 2014-05-30 NOTE — BHH Group Notes (Addendum)
[facility] Group Notes:  (Nursing/MHT/Case Management/Adjunct)  Date:  05/30/2014  Time:  10:21 AM  Type of Therapy:  Psychoeducational Skills  Participation Level:  Active  Participation Quality:  Appropriate  Affect:  Appropriate  Cognitive:  Appropriate  Insight:  Appropriate  Engagement in Group:  Engaged  Modes of Intervention:  Discussion  Summary of Progress/Problems: Pt did attend self inventory group, pt reported that he was negative SI/HI, no AH/VH noted. Pt rated his depression as a 1, and his helplessness/hopelessness as a 0.     Pt reported concerns about his discharge today and getting samples, pt informed that the doctor would be made aware.   Serinity Ware Shanta Worley Radermacher 05/30/2014, 10:21 AM

## 2014-05-30 NOTE — BHH Group Notes (Signed)
[facility] Group Notes: (Clinical Social Work)   05/30/2014      Type of Therapy:  Group Therapy   Participation Level:  Did Not Attend    Ambrose Mantle, LCSW 05/30/2014, 1:39 PM

## 2014-06-03 NOTE — Progress Notes (Signed)
Patient Discharge Instructions:  After Visit Summary (AVS):   Faxed to:  06/03/14 Psychiatric Admission Assessment Note:   Faxed to:  06/03/14 Suicide Risk Assessment - Discharge Assessment:   Faxed to:  06/03/14 Faxed/Sent to the Next Level Care provider:  06/03/14 Faxed to [facility] @ [phone]  Jerelene Redden, 06/03/2014, 3:27 PM

## 2014-06-17 NOTE — Discharge Summary (Signed)
Physician Discharge Summary Note  Patient:  Frank Salinas is an 23 y.o., male MRN:  [id number] DOB:  Apr 17, 1991 Patient phone:  [phone] (home)  Patient address:   [address] [place]  [address],  Total Time spent with patient: 30 minutes  Date of Admission:  05/28/2014 Date of Discharge: 05/30/2014  Reason for Admission:    Discharge Diagnoses: Active Problems:   Alcohol dependence   Post traumatic stress disorder (PTSD)   Psychiatric Specialty Exam: Physical Exam  Review of Systems  Constitutional: Negative.   HENT: Negative.   Eyes: Negative.   Respiratory: Negative.   Cardiovascular: Negative.   Gastrointestinal: Negative.   Genitourinary: Negative.   Musculoskeletal: Negative.   Skin: Negative.   Neurological: Negative.   Endo/Heme/Allergies: Negative.   Psychiatric/Behavioral: Positive for depression. The patient is nervous/anxious.     Blood pressure 122/84, pulse 80, temperature 97.5 F (36.4 C), temperature source Oral, resp. rate 18, height 5\' 6"  (1.676 m), weight 76.658 kg (169 lb), SpO2 97.00%.Body mass index is 27.29 kg/(m^2).   General Appearance: Well Groomed   Patent attorney:: Good   Speech: Normal Rate   Volume: Normal   Mood: Euthymic   Affect: Appropriate   Thought Process: Linear   Orientation: Full (Time, Place, and Person)   Thought Content: no psychotic symptoms   Suicidal Thoughts: No   Homicidal Thoughts: No   Memory: NA   Judgement: Good   Insight: Good   Psychomotor Activity: Normal   Concentration: Good   Recall: Good   Fund of Knowledge:Good   Language: Good   Akathisia: No   Handed: Right   AIMS (if indicated):   Assets: Desire for Improvement  Physical Health  Social Support   Sleep: Number of Hours: 6.5    Musculoskeletal:  Strength & Muscle Tone: within normal limits  Gait & Station: normal  Patient leans: N/A   DSM5:  Substance/Addictive Disorders:  Alcohol Related Disorder - Severe (303.90) Depressive  Disorders:  Major Depressive Disorder - Severe (296.23)  Axis Diagnosis:   AXIS I:  Alcohol Abuse, Major Depression, Recurrent severe and Substance Induced Mood Disorder AXIS II:  Deferred AXIS III:   Past Medical History  Diagnosis Date  . Bronchitis   . Anxiety   . Depression    AXIS IV:  other psychosocial or environmental problems and problems related to social environment AXIS V:  61-70 mild symptoms  Level of Care:  OP  Hospital Course:  23 Y/o male who states he has realized he is drinking too much. Drinks a fifth a day. States he has been drinking like that for the last 6 months. In the past he was drinking half a fifth a night. States he was under stress, started to work, worries about if the money was going to be there. Things that happen at [place] started bothering him. He was D/C from the service as he tested pos for Opioids. Was in the [employer] three and a half years and had a difficult time adjusting.  During Hospitalization: Medications managed, psychoeducation, group and individual therapy. Pt currently denies SI, HI, and Psychosis. At discharge, pt rates anxiety and depression as minimal. Pt states that he does have a good supportive home environment and will followup with outpatient treatment at [facility]. Affirms agreement with medication regimen and discharge plan. Denies other physical and psychological concerns at time of discharge.   Consults:  None  Significant Diagnostic Studies:  None  Discharge Vitals:   Blood pressure 122/84, pulse  80, temperature 97.5 F (36.4 C), temperature source Oral, resp. rate 18, height 5\' 6"  (1.676 m), weight 76.658 kg (169 lb), SpO2 97.00%. Body mass index is 27.29 kg/(m^2). Lab Results:   No results found for this or any previous visit (from the past 72 hour(s)).  Physical Findings: AIMS: Facial and Oral Movements Muscles of Facial Expression: None, normal Lips and Perioral Area: None, normal Jaw: None, normal Tongue: None,  normal,Extremity Movements Upper (arms, wrists, hands, fingers): None, normal Lower (legs, knees, ankles, toes): None, normal, Trunk Movements Neck, shoulders, hips: None, normal, Overall Severity Severity of abnormal movements (highest score from questions above): None, normal Incapacitation due to abnormal movements: None, normal Patient's awareness of abnormal movements (rate only patient's report): No Awareness, Dental Status Current problems with teeth and/or dentures?: No Does patient usually wear dentures?: No  CIWA:  CIWA-Ar Total: 0 COWS:  COWS Total Score: 2  Psychiatric Specialty Exam: See Psychiatric Specialty Exam and Suicide Risk Assessment completed by Attending Physician prior to discharge.  Discharge destination:  Home  Is patient on multiple antipsychotic therapies at discharge:  No   Has Patient had three or more failed trials of antipsychotic monotherapy by history:  No  Recommended Plan for Multiple Antipsychotic Therapies: NA     Medication List    STOP taking these medications       aspirin 325 MG tablet      TAKE these medications     Indication   clindamycin 150 MG capsule  Commonly known as:  CLEOCIN  Take 1 capsule (150 mg total) by mouth every 6 (six) hours.   Indication:  infection     hydrOXYzine 25 MG tablet  Commonly known as:  ATARAX/VISTARIL  Take 1 tablet (25 mg total) by mouth every 6 (six) hours as needed for anxiety.   Indication:  anxiety     naproxen 500 MG tablet  Commonly known as:  NAPROSYN  Take 1 tablet (500 mg total) by mouth 2 (two) times daily.   Indication:  pain     prazosin 1 MG capsule  Commonly known as:  MINIPRESS  Take 1 capsule (1 mg total) by mouth at bedtime.   Indication:  nightmares/ptsd     traMADol 50 MG tablet  Commonly known as:  ULTRAM  Take 1 tablet (50 mg total) by mouth every 6 (six) hours as needed.   Indication:  Moderate to Moderately Severe Pain     traZODone 50 MG tablet  Commonly  known as:  DESYREL  Take 1 tablet (50 mg total) by mouth at bedtime as needed for sleep.   Indication:  Trouble Sleeping           Follow-up Information   Follow up with [facility]. (Walk in between 8am-9am Monday through Friday for hospital followup/medication management/assessment for therapy services. )    Contact information:   [address] [place], [place] [address] Phone: [phone] Fax: [phone]      Follow-up recommendations:  Activity:  As tolerated Diet:  Heart healthy with low sodium.  Comments:   Take all medications as prescribed. Keep all follow-up appointments as scheduled.  Do not consume alcohol or use illegal drugs while on prescription medications. Report any adverse effects from your medications to your primary care provider promptly.  In the event of recurrent symptoms or worsening symptoms, call 911, a crisis hotline, or go to the nearest emergency department for evaluation.   Total Discharge Time:  Greater than 30 minutes.  Signed:  Beau FannyWithrow, Haidyn Chadderdon C, FNP-BC 05/30/2014, 5:58 PM

## 2014-06-18 NOTE — Discharge Summary (Signed)
Patient seen, Suicide Assessment Completed.  Disposition Plan Reviewed  

## 2014-10-18 ENCOUNTER — Emergency Department (HOSPITAL_BASED_OUTPATIENT_CLINIC_OR_DEPARTMENT_OTHER)
Admission: EM | Admit: 2014-10-18 | Discharge: 2014-10-19 | Disposition: A | Discharge status: Home / Self Care | Payer: Medicaid Other | Attending: Emergency Medicine | Admitting: Emergency Medicine

## 2014-10-18 ENCOUNTER — Encounter (HOSPITAL_BASED_OUTPATIENT_CLINIC_OR_DEPARTMENT_OTHER): Payer: Self-pay | Admitting: Emergency Medicine

## 2014-10-18 DIAGNOSIS — F419 Anxiety disorder, unspecified: Secondary | ICD-10-CM | POA: Insufficient documentation

## 2014-10-18 DIAGNOSIS — Z720 Tobacco use: Secondary | ICD-10-CM | POA: Insufficient documentation

## 2014-10-18 DIAGNOSIS — F4310 Post-traumatic stress disorder, unspecified: Secondary | ICD-10-CM | POA: Insufficient documentation

## 2014-10-18 DIAGNOSIS — Z8709 Personal history of other diseases of the respiratory system: Secondary | ICD-10-CM | POA: Insufficient documentation

## 2014-10-18 DIAGNOSIS — K029 Dental caries, unspecified: Secondary | ICD-10-CM | POA: Insufficient documentation

## 2014-10-18 DIAGNOSIS — F329 Major depressive disorder, single episode, unspecified: Secondary | ICD-10-CM | POA: Insufficient documentation

## 2014-10-18 DIAGNOSIS — Z79899 Other long term (current) drug therapy: Secondary | ICD-10-CM | POA: Insufficient documentation

## 2014-10-18 HISTORY — DX: Post-traumatic stress disorder, unspecified: F43.10

## 2014-10-18 NOTE — ED Notes (Signed)
Pt reports severe dental pain to bilateral lower teeth and the gums around them.  Reports been going on for 1.5 weeks and reports he is going to have them removed soon.

## 2014-10-18 NOTE — ED Provider Notes (Signed)
CSN: [id number]     Arrival date & time 10/18/14  2318 History  This chart was scribed for Tracey Stewart Smitty CordsK Torin Modica-Rasch, MD by Roxy Cedarhandni Bhalodia, ED Scribe. This patient was seen in room MH06/MH06 and the patient's care was started at 12:04 AM.   Chief Complaint  Patient presents with  . Dental Pain   Patient is a 23 y.o. male presenting with tooth pain. The history is provided by the patient. No language interpreter was used.  Dental Pain Location:  Lower Quality:  Aching Severity:  Moderate Onset quality:  Gradual Duration:  2 weeks Timing:  Constant Progression:  Waxing and waning Chronicity:  Recurrent Relieved by:  Nothing Worsened by:  Nothing tried Associated symptoms: gum swelling   Associated symptoms: no neck pain and no trismus    HPI Comments: Frank Salinas is a 23 y.o. male who presents to the Emergency Department complaining of moderate bilateral lower tooth dental pain around the gums that began 1.5 weeks ago. He states that his pain has gradually worsened. He states that he is going to have them removed soon.  Past Medical History  Diagnosis Date  . Bronchitis   . Anxiety   . Depression   . PTSD (post-traumatic stress disorder)    History reviewed. No pertinent past surgical history. No family history on file. History  Substance Use Topics  . Smoking status: Current Every Day Smoker -- 1.00 packs/day for 10 years    Types: Cigarettes  . Smokeless tobacco: Not on file  . Alcohol Use: No  Review of Systems  HENT: Positive for dental problem.   Musculoskeletal: Negative for neck pain.  All other systems reviewed and are negative.  Allergies  Review of patient's allergies indicates no known allergies.  Home Medications   Prior to Admission medications   Medication Sig Start Date End Date Taking? Authorizing Provider  QUEtiapine (SEROQUEL) 100 MG tablet Take 100 mg by mouth at bedtime.   Yes Historical Provider, MD  clindamycin (CLEOCIN) 150 MG capsule Take 1  capsule (150 mg total) by mouth every 6 (six) hours. 05/30/14   Beau FannyJohn C Withrow, FNP  hydrOXYzine (ATARAX/VISTARIL) 25 MG tablet Take 1 tablet (25 mg total) by mouth every 6 (six) hours as needed for anxiety. 05/30/14   Beau FannyJohn C Withrow, FNP  naproxen (NAPROSYN) 500 MG tablet Take 1 tablet (500 mg total) by mouth 2 (two) times daily. 05/30/14   Beau FannyJohn C Withrow, FNP  prazosin (MINIPRESS) 1 MG capsule Take 1 capsule (1 mg total) by mouth at bedtime. 05/30/14   Beau FannyJohn C Withrow, FNP  traMADol (ULTRAM) 50 MG tablet Take 1 tablet (50 mg total) by mouth every 6 (six) hours as needed. 05/30/14   Beau FannyJohn C Withrow, FNP  traZODone (DESYREL) 50 MG tablet Take 1 tablet (50 mg total) by mouth at bedtime as needed for sleep. 05/30/14   Beau FannyJohn C Withrow, FNP   Triage Vitals: BP 150/87  Pulse 86  Temp(Src) 98.6 F (37 C) (Oral)  Resp 20  Ht 5\' 6"  (1.676 m)  Wt 150 lb (68.04 kg)  BMI 24.22 kg/m2  SpO2 100%  Physical Exam  Nursing note and vitals reviewed. Constitutional: He is oriented to person, place, and time. He appears well-developed and well-nourished.  HENT:  Head: Normocephalic and atraumatic.  Mouth/Throat: Oropharynx is clear and moist. No oral lesions. No trismus in the jaw. Abnormal dentition. Dental caries present. No dental abscesses or uvula swelling. No oropharyngeal exudate.  Eyes: Conjunctivae are normal. Right  eye exhibits no discharge. Left eye exhibits no discharge.  Neck: Normal range of motion. Neck supple. No tracheal deviation present.  No lymph nodes.  Cardiovascular: Normal rate, regular rhythm and normal heart sounds.   Pulmonary/Chest: Effort normal and breath sounds normal. No respiratory distress. He has no wheezes. He has no rales.  Abdominal: Soft. Bowel sounds are normal. He exhibits no distension and no mass. There is no tenderness. There is no rebound and no guarding.  Musculoskeletal: He exhibits no edema and no tenderness.  Lymphadenopathy:    He has no cervical adenopathy.   Neurological: He is alert and oriented to person, place, and time. Coordination normal.  Skin: Skin is warm and dry. No rash noted. He is not diaphoretic. No erythema.  Psychiatric: He has a normal mood and affect.   ED Course  Procedures (including critical care time)  DIAGNOSTIC STUDIES: Oxygen Saturation is 100% on RA, normal by my interpretation.    COORDINATION OF CARE: 12:07 AM- Discussed plans to give patient medication for pain management. Pt advised of plan for treatment and pt agrees.  Labs Review Labs Reviewed - No data to display  Imaging Review No results found.   EKG Interpretation None     MDM   Final diagnoses:  None    Penicillin pain medication and close follow up with dentist.  Patient verbalizes understanding and agrees to follow up  I personally performed the services described in this documentation, which was scribed in my presence. The recorded information has been reviewed and is accurate.  Mathis Cashman Smitty CordsK Maudene Stotler-Rasch, MD 10/19/14 [phone]

## 2014-10-19 ENCOUNTER — Encounter (HOSPITAL_BASED_OUTPATIENT_CLINIC_OR_DEPARTMENT_OTHER): Payer: Self-pay | Admitting: Emergency Medicine

## 2014-10-19 MED ORDER — HYDROCODONE-ACETAMINOPHEN 5-325 MG PO TABS
1.0000 | ORAL_TABLET | Freq: Once | ORAL | Status: AC
  Administered 2014-10-19: 1 via ORAL
  Filled 2014-10-19: qty 1

## 2014-10-19 MED ORDER — PENICILLIN V POTASSIUM 250 MG PO TABS
500.0000 mg | ORAL_TABLET | Freq: Once | ORAL | Status: AC
  Administered 2014-10-19: 500 mg via ORAL
  Filled 2014-10-19: qty 2

## 2014-10-19 MED ORDER — NAPROXEN 375 MG PO TABS
375.0000 mg | ORAL_TABLET | Freq: Two times a day (BID) | ORAL | Status: DC
Start: 2014-10-19 — End: 2015-11-16

## 2014-10-19 MED ORDER — HYDROCODONE-ACETAMINOPHEN 7.5-325 MG/15ML PO SOLN: 10.0000 mL | ORAL | Status: DC | PRN

## 2014-10-19 MED ORDER — PENICILLIN V POTASSIUM 500 MG PO TABS: 500.0000 mg | ORAL_TABLET | Freq: Four times a day (QID) | ORAL | Status: AC

## 2014-10-19 NOTE — Discharge Instructions (Signed)
Dental Care and Dentist Visits  Dental care supports good overall health. Regular dental visits can also help you avoid dental pain, bleeding, infection, and other more serious health problems in the future. It is important to keep the mouth healthy because diseases in the teeth, gums, and other oral tissues can spread to other areas of the body. Some problems, such as diabetes, heart disease, and pre-term labor have been associated with poor oral health.   See your dentist every 6 months. If you experience emergency problems such as a toothache or broken tooth, go to the dentist right away. If you see your dentist regularly, you may catch problems early. It is easier to be treated for problems in the early stages.   WHAT TO EXPECT AT A DENTIST VISIT   Your dentist will look for many common oral health problems and recommend proper treatment. At your regular dental visit, you can expect:  · Gentle cleaning of the teeth and gums. This includes scraping and polishing. This helps to remove the sticky substance around the teeth and gums (plaque). Plaque forms in the mouth shortly after eating. Over time, plaque hardens on the teeth as tartar. If tartar is not removed regularly, it can cause problems. Cleaning also helps remove stains.  · Periodic X-rays. These pictures of the teeth and supporting bone will help your dentist assess the health of your teeth.  · Periodic fluoride treatments. Fluoride is a natural mineral shown to help strengthen teeth. Fluoride treatment involves applying a fluoride gel or varnish to the teeth. It is most commonly done in children.  · Examination of the mouth, tongue, jaws, teeth, and gums to look for any oral health problems, such as:  ¨ Cavities (dental caries). This is decay on the tooth caused by plaque, sugar, and acid in the mouth. It is best to catch a cavity when it is small.  ¨ Inflammation of the gums caused by plaque buildup (gingivitis).  ¨ Problems with the mouth or malformed  or misaligned teeth.  ¨ Oral cancer or other diseases of the soft tissues or jaws.   KEEP YOUR TEETH AND GUMS HEALTHY  For healthy teeth and gums, follow these general guidelines as well as your dentist's specific advice:  · Have your teeth professionally cleaned at the dentist every 6 months.  · Brush twice daily with a fluoride toothpaste.  · Floss your teeth daily.   · Ask your dentist if you need fluoride supplements, treatments, or fluoride toothpaste.  · Eat a healthy diet. Reduce foods and drinks with added sugar.  · Avoid smoking.  TREATMENT FOR ORAL HEALTH PROBLEMS  If you have oral health problems, treatment varies depending on the conditions present in your teeth and gums.  · Your caregiver will most likely recommend good oral hygiene at each visit.  · For cavities, gingivitis, or other oral health disease, your caregiver will perform a procedure to treat the problem. This is typically done at a separate appointment. Sometimes your caregiver will refer you to another dental specialist for specific tooth problems or for surgery.  SEEK IMMEDIATE DENTAL CARE IF:  · You have pain, bleeding, or soreness in the gum, tooth, jaw, or mouth area.  · A permanent tooth becomes loose or separated from the gum socket.  · You experience a blow or injury to the mouth or jaw area.  Document Released: 08/23/2011 Document Revised: 03/04/2012 Document Reviewed: 08/23/2011  ExitCare® Patient Information ©2015 [employer]. This information is not intended to replace advice   given to you by your health care provider. Make sure you discuss any questions you have with your health care provider.

## 2015-01-07 ENCOUNTER — Emergency Department (HOSPITAL_BASED_OUTPATIENT_CLINIC_OR_DEPARTMENT_OTHER)
Admission: EM | Admit: 2015-01-07 | Discharge: 2015-01-07 | Disposition: A | Discharge status: Home / Self Care | Payer: Medicaid Other | Attending: Emergency Medicine | Admitting: Emergency Medicine

## 2015-01-07 ENCOUNTER — Encounter (HOSPITAL_BASED_OUTPATIENT_CLINIC_OR_DEPARTMENT_OTHER): Payer: Self-pay

## 2015-01-07 DIAGNOSIS — R52 Pain, unspecified: Secondary | ICD-10-CM | POA: Insufficient documentation

## 2015-01-07 DIAGNOSIS — Z8709 Personal history of other diseases of the respiratory system: Secondary | ICD-10-CM | POA: Insufficient documentation

## 2015-01-07 DIAGNOSIS — Z720 Tobacco use: Secondary | ICD-10-CM | POA: Insufficient documentation

## 2015-01-07 DIAGNOSIS — R197 Diarrhea, unspecified: Secondary | ICD-10-CM | POA: Insufficient documentation

## 2015-01-07 DIAGNOSIS — F4310 Post-traumatic stress disorder, unspecified: Secondary | ICD-10-CM | POA: Insufficient documentation

## 2015-01-07 DIAGNOSIS — F419 Anxiety disorder, unspecified: Secondary | ICD-10-CM | POA: Insufficient documentation

## 2015-01-07 DIAGNOSIS — R112 Nausea with vomiting, unspecified: Secondary | ICD-10-CM | POA: Insufficient documentation

## 2015-01-07 DIAGNOSIS — Z791 Long term (current) use of non-steroidal anti-inflammatories (NSAID): Secondary | ICD-10-CM | POA: Insufficient documentation

## 2015-01-07 DIAGNOSIS — F329 Major depressive disorder, single episode, unspecified: Secondary | ICD-10-CM | POA: Insufficient documentation

## 2015-01-07 DIAGNOSIS — K029 Dental caries, unspecified: Secondary | ICD-10-CM

## 2015-01-07 MED ORDER — CIPROFLOXACIN HCL 500 MG PO TABS: 500.0000 mg | ORAL_TABLET | Freq: Two times a day (BID) | ORAL | Status: DC

## 2015-01-07 MED ORDER — METRONIDAZOLE 500 MG PO TABS: 500.0000 mg | ORAL_TABLET | Freq: Two times a day (BID) | ORAL | Status: DC

## 2015-01-07 MED ORDER — ONDANSETRON 8 MG PO TBDP
8.0000 mg | ORAL_TABLET | Freq: Once | ORAL | Status: AC
  Administered 2015-01-07: 8 mg via ORAL
  Filled 2015-01-07: qty 1

## 2015-01-07 MED ORDER — ONDANSETRON 8 MG PO TBDP: 8.0000 mg | ORAL_TABLET | Freq: Three times a day (TID) | ORAL | Status: DC | PRN

## 2015-01-07 NOTE — ED Provider Notes (Signed)
CSN: [id number]     Arrival date & time 01/07/15  1615 History   First MD Initiated Contact with Patient 01/07/15 1624     Chief Complaint  Patient presents with  . Emesis     (Consider location/radiation/quality/duration/timing/severity/associated sxs/prior Treatment) HPI  24 year old male comes in today complaining of nausea vomiting and diarrhea that began on Monday. He has not had any fever but he has had diffuse body aches. He describes the vomiting as being a total about 82 of which occurred today. He is vomited up prior food or fluid intake. He has had repeat runny clear stools. He has not noted any blood. He has had some crampy abdominal pain after the vomiting or the diarrhea but has not had generalized or worsening abdominal pain occurred prior to this. He has no known sick contacts or suspicious food intake. He also has severe dental caries. He was seen in urgent care on Monday and given a prescription for penicillin. Not had increased swelling around his teeth and has not noted any localized as formation. He is not having any sore throat or difficulty swallowing. He has had some coughing which started today.  Past Medical History  Diagnosis Date  . Bronchitis   . Anxiety   . Depression   . PTSD (post-traumatic stress disorder)    History reviewed. No pertinent past surgical history. No family history on file. History  Substance Use Topics  . Smoking status: Current Every Day Smoker -- 1.00 packs/day for 10 years    Types: Cigarettes  . Smokeless tobacco: Not on file  . Alcohol Use: No    Review of Systems  All other systems reviewed and are negative.     Allergies  Review of patient's allergies indicates no known allergies.  Home Medications   Prior to Admission medications   Medication Sig Start Date End Date Taking? Authorizing Provider  clindamycin (CLEOCIN) 150 MG capsule Take 1 capsule (150 mg total) by mouth every 6 (six) hours. 05/30/14   Beau FannyJohn C Withrow,  FNP  HYDROcodone-acetaminophen (HYCET) 7.5-325 mg/15 ml solution Take 10 mLs by mouth every 4 (four) hours as needed for moderate pain or severe pain. 10/19/14   April K Palumbo-Rasch, MD  hydrOXYzine (ATARAX/VISTARIL) 25 MG tablet Take 1 tablet (25 mg total) by mouth every 6 (six) hours as needed for anxiety. 05/30/14   Beau FannyJohn C Withrow, FNP  naproxen (NAPROSYN) 375 MG tablet Take 1 tablet (375 mg total) by mouth 2 (two) times daily. 10/19/14   April K Palumbo-Rasch, MD  naproxen (NAPROSYN) 500 MG tablet Take 1 tablet (500 mg total) by mouth 2 (two) times daily. 05/30/14   Beau FannyJohn C Withrow, FNP  prazosin (MINIPRESS) 1 MG capsule Take 1 capsule (1 mg total) by mouth at bedtime. 05/30/14   Beau FannyJohn C Withrow, FNP  QUEtiapine (SEROQUEL) 100 MG tablet Take 100 mg by mouth at bedtime.    Historical Provider, MD  traMADol (ULTRAM) 50 MG tablet Take 1 tablet (50 mg total) by mouth every 6 (six) hours as needed. 05/30/14   Beau FannyJohn C Withrow, FNP  traZODone (DESYREL) 50 MG tablet Take 1 tablet (50 mg total) by mouth at bedtime as needed for sleep. 05/30/14   Everardo AllJohn C Withrow, FNP   BP 129/79 mmHg  Pulse 95  Temp(Src) 98.4 F (36.9 C) (Oral)  Resp 16  Ht 5\' 6"  (1.676 m)  Wt 150 lb (68.04 kg)  BMI 24.22 kg/m2  SpO2 98% Physical Exam  Constitutional: He is oriented  to person, place, and time. He appears well-developed and well-nourished.  HENT:  Head: Normocephalic and atraumatic.  Right Ear: External ear normal.  Left Ear: External ear normal.  Nose: Nose normal.  Mouth/Throat: Oropharynx is clear and moist.  Eyes: Conjunctivae and EOM are normal. Pupils are equal, round, and reactive to light.  Neck: Normal range of motion. Neck supple.  Cardiovascular: Normal rate, regular rhythm, normal heart sounds and intact distal pulses.   Pulmonary/Chest: Effort normal and breath sounds normal. No respiratory distress. He has no wheezes. He exhibits no tenderness.  Abdominal: Soft. Bowel sounds are normal. He exhibits no  distension and no mass. There is no tenderness. There is no guarding.  Musculoskeletal: Normal range of motion.  Neurological: He is alert and oriented to person, place, and time. He has normal reflexes. He exhibits normal muscle tone. Coordination normal.  Skin: Skin is warm and dry.  Psychiatric: He has a normal mood and affect. His behavior is normal. Judgment and thought content normal.  Nursing note and vitals reviewed.   ED Course  Procedures (including critical care time) Labs Review Labs Reviewed - No data to display  Imaging Review No results found.   EKG Interpretation None      MDM   Final diagnoses:  Nausea vomiting and diarrhea  Dental caries    1 gastroenteritis plan Zofran, Cipro, and Flagyl. Patient with ongoing diarrhea in fourth day. Discussed oral rehydration with patient. 2 dental caries patient is following up with dentist this is his dental insurance card comes in. His not appear to have any acute problems with his dental caries today.    Hilario Quarry, MD 01/07/15 [id number]

## 2015-01-07 NOTE — Discharge Instructions (Signed)
Diarrhea °Diarrhea is watery poop (stool). It can make you feel weak, tired, thirsty, or give you a dry mouth (signs of dehydration). Watery poop is a sign of another problem, most often an infection. It often lasts 2-3 days. It can last longer if it is a sign of something serious. Take care of yourself as told by your doctor. °HOME CARE  °· Drink 1 cup (8 ounces) of fluid each time you have watery poop. °· Do not drink the following fluids: °¨ Those that contain simple sugars (fructose, glucose, galactose, lactose, sucrose, maltose). °¨ Sports drinks. °¨ Fruit juices. °¨ Whole milk products. °¨ Sodas. °¨ Drinks with caffeine (coffee, tea, soda) or alcohol. °· Oral rehydration solution may be used if the doctor says it is okay. You may make your own solution. Follow this recipe: °¨  - teaspoon table salt. °¨ ¾ teaspoon baking soda. °¨  teaspoon salt substitute containing potassium chloride. °¨ 1 tablespoons sugar. °¨ 1 liter (34 ounces) of water. °· Avoid the following foods: °¨ High fiber foods, such as raw fruits and vegetables. °¨ Nuts, seeds, and whole grain breads and cereals. °¨  Those that are sweetened with sugar alcohols (xylitol, sorbitol, mannitol). °· Try eating the following foods: °¨ Starchy foods, such as rice, toast, pasta, low-sugar cereal, oatmeal, baked potatoes, crackers, and bagels. °¨ Bananas. °¨ Applesauce. °· Eat probiotic-rich foods, such as yogurt and milk products that are fermented. °· Wash your hands well after each time you have watery poop. °· Only take medicine as told by your doctor. °· Take a warm bath to help lessen burning or pain from having watery poop. °GET HELP RIGHT AWAY IF:  °· You cannot drink fluids without throwing up (vomiting). °· You keep throwing up. °· You have blood in your poop, or your poop looks black and tarry. °· You do not pee (urinate) in 6-8 hours, or there is only a small amount of very dark pee. °· You have belly (abdominal) pain that gets worse or stays  in the same spot (localizes). °· You are weak, dizzy, confused, or light-headed. °· You have a very bad headache. °· Your watery poop gets worse or does not get better. °· You have a fever or lasting symptoms for more than 2-3 days. °· You have a fever and your symptoms suddenly get worse. °MAKE SURE YOU:  °· Understand these instructions. °· Will watch your condition. °· Will get help right away if you are not doing well or get worse. °Document Released: 05/29/2008 Document Revised: 04/27/2014 Document Reviewed: 08/18/2012 °ExitCare® Patient Information ©2015 [employer]. This information is not intended to replace advice given to you by your health care provider. Make sure you discuss any questions you have with your health care provider. ° °

## 2015-01-07 NOTE — ED Notes (Signed)
Emesis, bodyaches, nausea since Monday. [facility]. Continued nausea and vomiting.

## 2015-08-13 ENCOUNTER — Encounter (HOSPITAL_BASED_OUTPATIENT_CLINIC_OR_DEPARTMENT_OTHER): Payer: Self-pay

## 2015-08-13 ENCOUNTER — Emergency Department (HOSPITAL_BASED_OUTPATIENT_CLINIC_OR_DEPARTMENT_OTHER)
Admission: EM | Admit: 2015-08-13 | Discharge: 2015-08-13 | Disposition: A | Discharge status: Home / Self Care | Payer: Medicaid Other | Attending: Emergency Medicine | Admitting: Emergency Medicine

## 2015-08-13 DIAGNOSIS — Z8709 Personal history of other diseases of the respiratory system: Secondary | ICD-10-CM | POA: Insufficient documentation

## 2015-08-13 DIAGNOSIS — Z79899 Other long term (current) drug therapy: Secondary | ICD-10-CM | POA: Insufficient documentation

## 2015-08-13 DIAGNOSIS — Z791 Long term (current) use of non-steroidal anti-inflammatories (NSAID): Secondary | ICD-10-CM | POA: Insufficient documentation

## 2015-08-13 DIAGNOSIS — K0889 Other specified disorders of teeth and supporting structures: Secondary | ICD-10-CM

## 2015-08-13 DIAGNOSIS — F419 Anxiety disorder, unspecified: Secondary | ICD-10-CM | POA: Insufficient documentation

## 2015-08-13 DIAGNOSIS — F4310 Post-traumatic stress disorder, unspecified: Secondary | ICD-10-CM | POA: Insufficient documentation

## 2015-08-13 DIAGNOSIS — F329 Major depressive disorder, single episode, unspecified: Secondary | ICD-10-CM | POA: Insufficient documentation

## 2015-08-13 DIAGNOSIS — K088 Other specified disorders of teeth and supporting structures: Secondary | ICD-10-CM | POA: Insufficient documentation

## 2015-08-13 DIAGNOSIS — Z720 Tobacco use: Secondary | ICD-10-CM | POA: Insufficient documentation

## 2015-08-13 MED ORDER — TRAMADOL HCL 50 MG PO TABS: 50.0000 mg | ORAL_TABLET | Freq: Four times a day (QID) | ORAL | Status: DC | PRN

## 2015-08-13 MED ORDER — TRAMADOL HCL 50 MG PO TABS
50.0000 mg | ORAL_TABLET | Freq: Once | ORAL | Status: AC
Start: 2015-08-13 — End: 2015-08-13
  Administered 2015-08-13: 50 mg via ORAL
  Filled 2015-08-13: qty 1

## 2015-08-13 MED ORDER — PENICILLIN V POTASSIUM 500 MG PO TABS
500.0000 mg | ORAL_TABLET | Freq: Four times a day (QID) | ORAL | Status: DC
Start: 2015-08-13 — End: 2015-11-16

## 2015-08-13 MED ORDER — PENICILLIN V POTASSIUM 250 MG PO TABS
500.0000 mg | ORAL_TABLET | Freq: Once | ORAL | Status: AC
  Administered 2015-08-13: 500 mg via ORAL
  Filled 2015-08-13: qty 2

## 2015-08-13 NOTE — Discharge Instructions (Signed)
Take the prescribed medication as directed. Follow-up with your dentist as soon as possible. Return to the ED for new or worsening symptoms. 

## 2015-08-13 NOTE — ED Notes (Signed)
Dental pain x 3 days.  States had several teeth extracted in April and has had pain since.  Has been taking Ibuprofen without relief.

## 2015-08-13 NOTE — ED Provider Notes (Signed)
CSN: [id number]     Arrival date & time 08/13/15  1053 History   First MD Initiated Contact with Patient 08/13/15 1156     Chief Complaint  Patient presents with  . Dental Pain     (Consider location/radiation/quality/duration/timing/severity/associated sxs/prior Treatment) Patient is a 24 y.o. male presenting with tooth pain. The history is provided by the patient and medical records.  Dental Pain   This is a 24 y.o. M with hx of bronchitis, anxiety, depression, PTSD, presenting to the ED for right and left lower dental pain.  Patient had multiple teeth extracted in April of this year, states has been having intermittent issues since this time.  States over the past month or so he has been doing fairly well, recently began having pain again.  States pain on right side is worse, feels a little bit of swelling.  Denies fever, chills, facial swelling, SOB, difficulty swallowing, or neck swelling.  Patient had prior dental work done by [facility] in [place] but cannot afford to go back right now because he is self pay.  VSS.  Past Medical History  Diagnosis Date  . Bronchitis   . Anxiety   . Depression   . PTSD (post-traumatic stress disorder)    History reviewed. No pertinent past surgical history. No family history on file. Social History  Substance Use Topics  . Smoking status: Current Every Day Smoker -- 1.00 packs/day for 10 years    Types: Cigarettes  . Smokeless tobacco: None  . Alcohol Use: No    Review of Systems  HENT: Positive for dental problem.   All other systems reviewed and are negative.     Allergies  Review of patient's allergies indicates no known allergies.  Home Medications   Prior to Admission medications   Medication Sig Start Date End Date Taking? Authorizing Provider  ciprofloxacin (CIPRO) 500 MG tablet Take 1 tablet (500 mg total) by mouth every 12 (twelve) hours. 01/07/15   Margarita Grizzle, MD  clindamycin (CLEOCIN) 150 MG capsule Take 1 capsule  (150 mg total) by mouth every 6 (six) hours. 05/30/14   Beau Fanny, FNP  HYDROcodone-acetaminophen (HYCET) 7.5-325 mg/15 ml solution Take 10 mLs by mouth every 4 (four) hours as needed for moderate pain or severe pain. 10/19/14   April Palumbo, MD  hydrOXYzine (ATARAX/VISTARIL) 25 MG tablet Take 1 tablet (25 mg total) by mouth every 6 (six) hours as needed for anxiety. 05/30/14   Beau Fanny, FNP  metroNIDAZOLE (FLAGYL) 500 MG tablet Take 1 tablet (500 mg total) by mouth 2 (two) times daily. 01/07/15   Margarita Grizzle, MD  naproxen (NAPROSYN) 375 MG tablet Take 1 tablet (375 mg total) by mouth 2 (two) times daily. 10/19/14   April Palumbo, MD  naproxen (NAPROSYN) 500 MG tablet Take 1 tablet (500 mg total) by mouth 2 (two) times daily. 05/30/14   Beau Fanny, FNP  ondansetron (ZOFRAN ODT) 8 MG disintegrating tablet Take 1 tablet (8 mg total) by mouth every 8 (eight) hours as needed for nausea or vomiting. 01/07/15   Margarita Grizzle, MD  prazosin (MINIPRESS) 1 MG capsule Take 1 capsule (1 mg total) by mouth at bedtime. 05/30/14   Beau Fanny, FNP  QUEtiapine (SEROQUEL) 100 MG tablet Take 100 mg by mouth at bedtime.    Historical Provider, MD  traMADol (ULTRAM) 50 MG tablet Take 1 tablet (50 mg total) by mouth every 6 (six) hours as needed. 05/30/14   Beau Fanny, FNP  traZODone (DESYREL) 50 MG tablet Take 1 tablet (50 mg total) by mouth at bedtime as needed for sleep. 05/30/14   Everardo All Withrow, FNP   BP 117/70 mmHg  Pulse 56  Temp(Src) 97.9 F (36.6 C) (Oral)  Resp 18  Ht  (1.676 m)  Wt 150 lb (68.04 kg)  BMI 24.22 kg/m2  SpO2 100%   Physical Exam  Constitutional: He is oriented to person, place, and time. He appears well-developed and well-nourished. No distress.  HENT:  Head: Normocephalic and atraumatic.  Mouth/Throat: Oropharynx is clear and moist.  Teeth largely in very poor dentition, multiple teeth have been extracted, right lower gums appear irritated and discolored, no definitive  abscess formation, no facial or neck swelling handling secretions appropriately, no trismus  Eyes: Conjunctivae and EOM are normal. Pupils are equal, round, and reactive to light.  Neck: Normal range of motion. Neck supple.  Cardiovascular: Normal rate, regular rhythm and normal heart sounds.   Pulmonary/Chest: Effort normal and breath sounds normal. No respiratory distress. He has no wheezes.  Abdominal: Soft. Bowel sounds are normal. There is no tenderness. There is no guarding.  Musculoskeletal: Normal range of motion.  Neurological: He is alert and oriented to person, place, and time.  Skin: Skin is warm and dry. He is not diaphoretic.  Psychiatric: He has a normal mood and affect.  Nursing note and vitals reviewed.   ED Course  Procedures (including critical care time) Labs Review Labs Reviewed - No data to display  Imaging Review No results found.    EKG Interpretation None      MDM   Final diagnoses:  Pain, dental   24 year old male here with recurrent dental pain. Cannot afford to go back to dentist at this time as he is self-pay. He is afebrile, nontoxic. His right lower gums do appear irritated discolored with concern for developing infection. There is no definitive abscess at this time. No facial or neck swelling to suggest Ludwigs angina. Patient will be restarted on antibiotics, short supply of pain medication.  He will follow-up with dentist as soon as possible.  Discussed plan with patient, he/she acknowledged understanding and agreed with plan of care.  Return precautions given for new or worsening symptoms.  Garlon Hatchet, PA-C 08/13/15 1246  Rolland Porter, MD 08/20/15 [phone]

## 2015-11-16 ENCOUNTER — Encounter (HOSPITAL_BASED_OUTPATIENT_CLINIC_OR_DEPARTMENT_OTHER): Payer: Self-pay

## 2015-11-16 ENCOUNTER — Emergency Department (HOSPITAL_BASED_OUTPATIENT_CLINIC_OR_DEPARTMENT_OTHER)
Admission: EM | Admit: 2015-11-16 | Discharge: 2015-11-16 | Disposition: A | Discharge status: Home / Self Care | Payer: Medicaid Other | Attending: Emergency Medicine | Admitting: Emergency Medicine

## 2015-11-16 DIAGNOSIS — F329 Major depressive disorder, single episode, unspecified: Secondary | ICD-10-CM | POA: Insufficient documentation

## 2015-11-16 DIAGNOSIS — F17210 Nicotine dependence, cigarettes, uncomplicated: Secondary | ICD-10-CM | POA: Insufficient documentation

## 2015-11-16 DIAGNOSIS — F4310 Post-traumatic stress disorder, unspecified: Secondary | ICD-10-CM | POA: Insufficient documentation

## 2015-11-16 DIAGNOSIS — F419 Anxiety disorder, unspecified: Secondary | ICD-10-CM | POA: Insufficient documentation

## 2015-11-16 DIAGNOSIS — Z8709 Personal history of other diseases of the respiratory system: Secondary | ICD-10-CM | POA: Insufficient documentation

## 2015-11-16 DIAGNOSIS — K0889 Other specified disorders of teeth and supporting structures: Secondary | ICD-10-CM | POA: Insufficient documentation

## 2015-11-16 MED ORDER — HYDROCODONE-ACETAMINOPHEN 5-325 MG PO TABS
1.0000 | ORAL_TABLET | Freq: Once | ORAL | Status: AC
Start: 2015-11-16 — End: 2015-11-16
  Administered 2015-11-16: 1 via ORAL
  Filled 2015-11-16: qty 1

## 2015-11-16 MED ORDER — PENICILLIN V POTASSIUM 500 MG PO TABS: 500.0000 mg | ORAL_TABLET | Freq: Four times a day (QID) | ORAL | Status: DC

## 2015-11-16 MED ORDER — HYDROCODONE-ACETAMINOPHEN 5-325 MG PO TABS: 1.0000 | ORAL_TABLET | ORAL | Status: DC | PRN

## 2015-11-16 NOTE — ED Notes (Signed)
C/o left upper and lower toothache-long hx of poor dental hygiene/dental work per pt

## 2015-11-16 NOTE — Discharge Instructions (Signed)
1. Medications: vicodin, penicillin, usual home medications 2. Treatment: rest, drink plenty of fluids 3. Follow Up: please followup with your dentist in 2-3 days for discussion of your diagnoses and further evaluation after today's visit; if you do not have a primary care doctor use the resource guide provided to find one; please return to the ER for severe pain, swelling, new or worsening symptoms   Emergency Department Resource Guide 1) Find a Doctor and Pay Out of Pocket Although you won't have to find out who is covered by your insurance plan, it is a good idea to ask around and get recommendations. You will then need to call the office and see if the doctor you have chosen will accept you as a new patient and what types of options they offer for patients who are self-pay. Some doctors offer discounts or will set up payment plans for their patients who do not have insurance, but you will need to ask so you aren't surprised when you get to your appointment.  2) Contact Your Local Health Department Not all health departments have doctors that can see patients for sick visits, but many do, so it is worth a call to see if yours does. If you don't know where your local health department is, you can check in your phone book. The CDC also has a tool to help you locate your state's health department, and many state websites also have listings of all of their local health departments.  3) Find a Walk-in Clinic If your illness is not likely to be very severe or complicated, you may want to try a walk in clinic. These are popping up all over the country in pharmacies, drugstores, and shopping centers. They're usually staffed by nurse practitioners or physician assistants that have been trained to treat common illnesses and complaints. They're usually fairly quick and inexpensive. However, if you have serious medical issues or chronic medical problems, these are probably not your best option.  No Primary  Care Doctor: - Call Health Connect at  [phone] - they can help you locate a primary care doctor that  accepts your insurance, provides certain services, etc. - Physician Referral Service- [phone]  Chronic Pain Problems: Organization         Address  Phone   Notes  [facility]  [phone] Patients need to be referred by their primary care doctor.   Medication Assistance: Organization         Address  Phone   Notes  [facility] Medication [facility] [address] [place], [place] [address] [phone] --Must be a resident of [facility] -- Must have NO insurance coverage whatsoever (no Medicaid/ Medicare, etc.) -- The pt. MUST have a primary care doctor that directs their care regularly and follows them in the community   Med[facility]ist  [phone]   [employer]  [phone]    Agencies that provide inexpensive medical care: Organization         Address  Phone   Notes  [facility]  [phone]   [facility]    [phone]   [facility] [address] [place], [place] [address] [phone]   [facility] [address] [place] [phone]   Planned Parenthood    [phone]   [facility]    [phone]   [facility]  [address], [place] Phone:  [phone], Fax:  [phone] Hours of Operation:  9 am - 6 pm, M-F.  Also accepts Medicaid/Medicare and self-pay.  [facility] for [place] [address], [address], [place] Phone: [phone], Fax: [phone]. Hours of Operation:  8:30 am - 5:30 pm, M-F.  Also accepts Medicaid and self-pay.  [facility] [place] [address], [place] Phone: [phone]   [place], [place], [place] [phone], Ext. [phone] Mondays & Thursdays: 7-9 AM.  First 15 patients are seen on a first  come, first serve basis.    [place] Providers:  Organization         Address  Phone   Notes  [facility] [address], [place] [phone] Also accepts self-pay patients.  [facility] [address], [place]  [phone]   [place], [address], [place] [phone]   [facility] [address], [place] [phone]   Lucianne Lei [address], [place]   [phone] Only accepts [place] Access [place] patients after they have their name applied to their card.   Self-Pay (no insurance) in [facility]:  Organization         Address  Phone   Notes  Sickle Cell Patients, [facility] [place] [phone]   [facility] [place] [phone]   [facility] [place]  [place], [place], [place] [phone]   [facility]/Dr. Osei-Bonsu  [address], [place] or [address], [place] [phone] Phone number for both [place] and [place] locations is the same.  [facility] [address], [place] [phone]   [facility] [address], [place] or [address] [phone]   [facility] [address], [place] [phone], phone; [phone], fax Sees patients 1st and 3rd Saturday of every month.  Must not qualify for public or private insurance (i.e. Medicaid, Medicare, [place] Health Choice, Veterans' Benefits)  Household income should be no more than 200% of the poverty level The clinic cannot treat you if you are pregnant or think you are pregnant  Sexually transmitted diseases are not treated at the clinic.    Dental Care: Organization          Address  Phone  Notes  [facility] [place] [phone] Accepts children up to age 13 who are enrolled in [place] or [place]; pregnant women with a Medicaid card; and children who have applied for Medicaid or [place] Health Choice, but were declined, whose parents can pay a reduced fee at time of service.  [facility] Department of [facility]  [address], [place] [phone] Accepts children up to age 8 who are enrolled in [place] or [place]; pregnant women with a Medicaid card; and children who have applied for Medicaid or [place] Health Choice, but were declined, whose parents can pay a reduced fee at time of service.  [facility] Adult Dental Access PROGRAM  [place] [phone] Patients are  seen by appointment only. Walk-ins are not accepted. [place] will see patients 20 years of age and older. Monday - Tuesday (8am-5pm) Most Wednesdays (8:30-5pm) $30 per visit, cash only  [facility] Adult Dental Access PROGRAM  [address], [facility] [phone] Patients are seen by appointment only. Walk-ins are not accepted. [place] will see patients 57 years of age and older. One Wednesday Evening (Monthly: Volunteer Based).  $30 per visit, cash only  [place]  [phone] for adults; Children under age 71, call Graduate Pediatric Dentistry at [phone]. Children aged 38-14, please call [phone] to request a pediatric application.  Dental services are provided in all areas of dental care including fillings, crowns and bridges, complete and partial dentures, implants, gum treatment, root canals, and extractions. Preventive care is also provided. Treatment is provided to both adults and children. Patients are selected via a lottery and there is often a waiting list.   [facility] [address], [place]  [phone] [email or web address]   [facility] [address] [place], [place] [phone], Ext. [phone] Second and Fourth Thursday of each month, opens at 6:30 AM; Clinic ends at 9 AM.  Patients are seen on a first-come first-served basis, and a limited number are seen during each clinic.   [facility]  [address] Hillard Danker [place], [place] [phone]   Eligibility Requirements You must have lived in [place], [place], or [place] counties for at least the last three months.   You cannot be eligible for state or federal sponsored [employer], including [employer], [place], or [employer].   You generally cannot be eligible for healthcare insurance through your employer.    How to apply: Eligibility screenings are held every Tuesday and Wednesday afternoon from 1:00 pm until 4:00 pm. You do not need an appointment for the interview!  [facility] [address] [place], [place]   [place]  [place] Department  [place]  [phone]    Behavioral Health Resources in the Community: Intensive Outpatient Programs Organization         Address  Phone  Notes  [place]. [address] [place], [place] [phone]   [facility] [address], [place], [place]   ADS: [facility] Drug S[facility] [address] [place], [place]   [place] [address]  [place], [place] or [phone]   Substance Abuse Resources Organization         Address  Phone  Notes  Alcohol and Drug Services  [phone]   [place]  [phone]   [facility] [place]   Chinita Pester  [phone]   Residential & Outpatient Substance Abuse Program  [phone]   Psychological  Services Organization         Address  Phone  Notes  [facility] [place]  [place]  [phone]   [place] [address] [place] or [phone]    Mobile Crisis Teams Organization         Address  Phone  Notes  [facility], Mobile Crisis Care Unit  [phone]   [facility]  [address] [place], [place]   [facility] [address] [place]  [place] [phone]    Self-Help/Support Groups Organization         Address  Phone             Notes  Mental Health Assoc. of  - variety of support groups  336- [phone] Call for more information  Narcotics Anonymous (NA), Caring [facility] [address], [place]  2 meetings at this location   Statisticianesidential Treatment Programs Organization         Address  Phone  Notes  [facility] [address],    [place]  [phone]   [facility]  [address], [place] [phone], [place], [place] [phone]   [facility] [address], [place] [phone] Admissions: 8am-3pm M-F  [facility] [address]    [place], [place] [phone]   [facility] [address], [place], [place] [phone]   [facility] [address]  [place], [place] [phone]   [facility] - Intensive Outpatient [address], [place], [place] [phone]   [facility] (Addiction Recovery Care [facility]oc[facility] [address]  [place], [place] [phone] or [phone]   [facility] (RTS) [address] [place], [place] [phone] Accepts Medicaid  [facility] [address]  [place] [phone] Substance Abuse/Addiction Treatment   [facility] Behavioral Health Resources Organization         Address  Phone  Notes  [facility]  [phone])  [phone]   Angie FavaJulie Brannon, PhD [address], [address] [place], [place]   [phone] or [phone]) [phone]   [facility]   [address] [place], [place] [phone]) [phone]   [facility] [address], [place], [place] [phone]) [phone] Insurance/Medicaid/sponsorship through [facility]  [facility] [address]                                    [place], [place] [phone]) [phone] Therapy/tele-psych/case  [facility] [address].   [place], [place] [phone]) [phone]    Dr. Lolly MustacheArfeen  [phone](336) [phone]   [facility]  [facility] 1) [address], [place] 2) [address], [place] 3)  [address], [place] [phone]) [phone]  [phone](336) [phone]   [facility] Child Abuse Hotline [phone]) [phone] or [phone] (After Hours)

## 2015-11-16 NOTE — ED Notes (Signed)
Patient stable and ambulatory.  Patient verbalizes understanding of discharge medications, instructions and follow-up. 

## 2015-11-16 NOTE — ED Provider Notes (Signed)
CSN: [id number]     Arrival date & time 11/16/15  1234 History   First MD Initiated Contact with Patient 11/16/15 1245     Chief Complaint  Patient presents with  . Dental Pain    HPI   Frank Salinas is a 24 y.o. male with a PMH of depression, anxiety, tobacco use, dental caries who presents to the ED with left upper and lower dental pain x 1 week. He reports his symptoms are constant and have been progressively worsening. He reports cold exacerbates his pain. He has tried ibuprofen with no significant symptom relief. He denies fever, chills, difficulty swallowing or handling secretions.    Past Medical History  Diagnosis Date  . Bronchitis   . Anxiety   . Depression   . PTSD (post-traumatic stress disorder)    History reviewed. No pertinent past surgical history. No family history on file. Social History  Substance Use Topics  . Smoking status: Current Every Day Smoker -- 1.00 packs/day for 10 years    Types: Cigarettes  . Smokeless tobacco: None  . Alcohol Use: No    Review of Systems  Constitutional: Negative for fever and chills.  HENT: Positive for dental problem. Negative for drooling and trouble swallowing.       Allergies  Review of patient's allergies indicates no known allergies.  Home Medications   Prior to Admission medications   Medication Sig Start Date End Date Taking? Authorizing Provider  HydrOXYzine Pamoate (VISTARIL PO) Take by mouth.   Yes Historical Provider, MD  QUEtiapine (SEROQUEL) 100 MG tablet Take 100 mg by mouth at bedtime.    Historical Provider, MD  traZODone (DESYREL) 50 MG tablet Take 1 tablet (50 mg total) by mouth at bedtime as needed for sleep. 05/30/14   Everardo AllJohn C Withrow, FNP    BP 123/72 mmHg  Pulse 62  Temp(Src) 98.2 F (36.8 C) (Oral)  Resp 16  Ht 5\' 6"  (1.676 m)  Wt 74.844 kg  BMI 26.64 kg/m2  SpO2 100% Physical Exam  Constitutional: He is oriented to person, place, and time. He appears well-developed and well-nourished. No  distress.  HENT:  Head: Normocephalic and atraumatic.  Right Ear: External ear normal.  Left Ear: External ear normal.  Nose: Nose normal.  Mouth/Throat: Oropharynx is clear and moist.    Eyes: Conjunctivae and EOM are normal. Right eye exhibits no discharge. Left eye exhibits no discharge. No scleral icterus.  Neck: Normal range of motion. Neck supple.  Cardiovascular: Normal rate and regular rhythm.   Pulmonary/Chest: Effort normal and breath sounds normal. No respiratory distress.  Musculoskeletal: Normal range of motion. He exhibits no edema or tenderness.  Neurological: He is alert and oriented to person, place, and time.  Skin: Skin is warm and dry. He is not diaphoretic.  Psychiatric: He has a normal mood and affect. His behavior is normal.  Nursing note and vitals reviewed.   ED Course  Procedures (including critical care time)  Labs Review Labs Reviewed - No data to display  Imaging Review No results found.     EKG Interpretation None      MDM   Final diagnoses:  Pain, dental    24 year old male presents with left upper and lower dental pain x 1 week. Denies fever, chills, difficulty swallowing or handling secretions. Patient is afebrile. Vital signs stable. Dental cary and associated TTP of left upper and left lower 1st molar. Mild erythema. No abscess. No swelling or TTP to floor  of mouth to suggest Ludwigs. Will discharge with antibiotic and analgesia. Patient to follow-up with dentist, dental resources given. Return precautions discussed. Patient verbalizes his understanding and is in agreement with plan.  BP 123/72 mmHg  Pulse 62  Temp(Src) 98.2 F (36.8 C) (Oral)  Resp 16  Ht  (1.676 m)  Wt 74.844 kg  BMI 26.64 kg/m2  SpO2 100%      Mady Gemma, PA-C 11/16/15 1435  Cathren Laine, MD 11/16/15 1456

## 2016-03-08 ENCOUNTER — Encounter (HOSPITAL_BASED_OUTPATIENT_CLINIC_OR_DEPARTMENT_OTHER): Payer: Self-pay | Admitting: *Deleted

## 2016-03-08 ENCOUNTER — Emergency Department (HOSPITAL_BASED_OUTPATIENT_CLINIC_OR_DEPARTMENT_OTHER)
Admission: EM | Admit: 2016-03-08 | Discharge: 2016-03-08 | Disposition: A | Discharge status: Home / Self Care | Payer: Medicaid Other | Attending: Emergency Medicine | Admitting: Emergency Medicine

## 2016-03-08 DIAGNOSIS — F329 Major depressive disorder, single episode, unspecified: Secondary | ICD-10-CM | POA: Insufficient documentation

## 2016-03-08 DIAGNOSIS — F419 Anxiety disorder, unspecified: Secondary | ICD-10-CM | POA: Insufficient documentation

## 2016-03-08 DIAGNOSIS — R1033 Periumbilical pain: Secondary | ICD-10-CM | POA: Insufficient documentation

## 2016-03-08 DIAGNOSIS — F17210 Nicotine dependence, cigarettes, uncomplicated: Secondary | ICD-10-CM | POA: Insufficient documentation

## 2016-03-08 DIAGNOSIS — Z8709 Personal history of other diseases of the respiratory system: Secondary | ICD-10-CM | POA: Insufficient documentation

## 2016-03-08 DIAGNOSIS — F4310 Post-traumatic stress disorder, unspecified: Secondary | ICD-10-CM | POA: Insufficient documentation

## 2016-03-08 DIAGNOSIS — R112 Nausea with vomiting, unspecified: Secondary | ICD-10-CM | POA: Insufficient documentation

## 2016-03-08 LAB — CBC WITH DIFFERENTIAL/PLATELET
BASOS PCT: 1 %
Basophils Absolute: 0 10*3/uL (ref 0.0–0.1)
EOS ABS: 0.2 10*3/uL (ref 0.0–0.7)
EOS PCT: 3 %
HCT: 49.3 % (ref 39.0–52.0)
HEMOGLOBIN: 17.9 g/dL — AB (ref 13.0–17.0)
LYMPHS ABS: 2.6 10*3/uL (ref 0.7–4.0)
Lymphocytes Relative: 46 %
MCH: 30.3 pg (ref 26.0–34.0)
MCHC: 36.3 g/dL — ABNORMAL HIGH (ref 30.0–36.0)
MCV: 83.6 fL (ref 78.0–100.0)
Monocytes Absolute: 0.7 10*3/uL (ref 0.1–1.0)
Monocytes Relative: 12 %
NEUTROS PCT: 38 %
Neutro Abs: 2.2 10*3/uL (ref 1.7–7.7)
PLATELETS: 204 10*3/uL (ref 150–400)
RBC: 5.9 MIL/uL — AB (ref 4.22–5.81)
RDW: 13.3 % (ref 11.5–15.5)
WBC: 5.7 10*3/uL (ref 4.0–10.5)

## 2016-03-08 LAB — COMPREHENSIVE METABOLIC PANEL
ALBUMIN: 4.8 g/dL (ref 3.5–5.0)
ALK PHOS: 40 U/L (ref 38–126)
ALT: 16 U/L — AB (ref 17–63)
ANION GAP: 10 (ref 5–15)
AST: 27 U/L (ref 15–41)
BUN: 9 mg/dL (ref 6–20)
CHLORIDE: 100 mmol/L — AB (ref 101–111)
CO2: 29 mmol/L (ref 22–32)
CREATININE: 1.01 mg/dL (ref 0.61–1.24)
Calcium: 9.6 mg/dL (ref 8.9–10.3)
GFR calc non Af Amer: >60 mL/min (ref >60)
GLUCOSE: 90 mg/dL (ref 65–99)
Potassium: 4.5 mmol/L (ref 3.5–5.1)
SODIUM: 139 mmol/L (ref 135–145)
Total Bilirubin: 0.7 mg/dL (ref 0.3–1.2)
Total Protein: 8 g/dL (ref 6.5–8.1)

## 2016-03-08 LAB — URINALYSIS, ROUTINE W REFLEX MICROSCOPIC
Glucose, UA: NEGATIVE mg/dL
Hgb urine dipstick: NEGATIVE
Ketones, ur: 15 mg/dL — AB
Leukocytes, UA: NEGATIVE
NITRITE: NEGATIVE
PH: 6 (ref 5.0–8.0)
Protein, ur: NEGATIVE mg/dL
SPECIFIC GRAVITY, URINE: 1.029 (ref 1.005–1.030)

## 2016-03-08 LAB — LIPASE, BLOOD: Lipase: 21 U/L (ref 11–51)

## 2016-03-08 MED ORDER — SODIUM CHLORIDE 0.9 % IV BOLUS (SEPSIS)
1000.0000 mL | Freq: Once | INTRAVENOUS | Status: AC
  Administered 2016-03-08: 1000 mL via INTRAVENOUS

## 2016-03-08 MED ORDER — ONDANSETRON HCL 4 MG/2ML IJ SOLN
4.0000 mg | INTRAMUSCULAR | Status: AC
  Administered 2016-03-08: 4 mg via INTRAVENOUS
  Filled 2016-03-08: qty 2

## 2016-03-08 MED ORDER — ONDANSETRON 4 MG PO TBDP: 4.0000 mg | ORAL_TABLET | Freq: Three times a day (TID) | ORAL | Status: DC | PRN

## 2016-03-08 NOTE — ED Notes (Signed)
N/v since last pm with body aches. No diarrhea. Onset last pm.

## 2016-03-08 NOTE — ED Provider Notes (Signed)
CSN: [id number]     Arrival date & time 03/08/16  1000 History   First MD Initiated Contact with Patient 03/08/16 1007     No chief complaint on file.   HPI   Frank Salinas is a 25 y.o. male with a PMH of anxiety and depression who presents to the ED with nausea and vomiting, which he states started last night. He reports 2 episodes of emesis. He denies hematemesis. He denies exacerbating or alleviating factors. He denies fever, though notes chills. He denies abdominal pain, diarrhea, constipation, dysuria, urgency, frequency. He reports associated poor PO intake.   Past Medical History  Diagnosis Date  . Bronchitis   . Anxiety   . Depression   . PTSD (post-traumatic stress disorder)    History reviewed. No pertinent past surgical history. No family history on file. Social History  Substance Use Topics  . Smoking status: Current Every Day Smoker -- 1.00 packs/day for 10 years    Types: Cigarettes  . Smokeless tobacco: None  . Alcohol Use: No      Review of Systems  Constitutional: Positive for chills. Negative for fever.  Gastrointestinal: Positive for nausea and vomiting. Negative for abdominal pain, diarrhea, constipation and blood in stool.  Genitourinary: Negative for dysuria, urgency and frequency.  All other systems reviewed and are negative.     Allergies  Review of patient's allergies indicates no known allergies.  Home Medications   Prior to Admission medications   Medication Sig Start Date End Date Taking? Authorizing Provider  HydrOXYzine Pamoate (VISTARIL PO) Take by mouth.   Yes Historical Provider, MD  QUEtiapine (SEROQUEL) 100 MG tablet Take 100 mg by mouth at bedtime.   Yes Historical Provider, MD  ondansetron (ZOFRAN ODT) 4 MG disintegrating tablet Take 1 tablet (4 mg total) by mouth every 8 (eight) hours as needed for nausea. 03/08/16   Mady GemmaElizabeth C Ortha Metts, PA-C    BP 124/92 mmHg  Pulse 71  Temp(Src) 98.5 F (36.9 C) (Oral)  Resp 18  Ht 5\' 6"   (1.676 m)  Wt 65.318 kg  BMI 23.25 kg/m2  SpO2 100% Physical Exam  Constitutional: He is oriented to person, place, and time. He appears well-developed and well-nourished. No distress.  HENT:  Head: Normocephalic and atraumatic.  Right Ear: External ear normal.  Left Ear: External ear normal.  Nose: Nose normal.  Mouth/Throat: Uvula is midline, oropharynx is clear and moist and mucous membranes are normal.  Eyes: Conjunctivae, EOM and lids are normal. Pupils are equal, round, and reactive to light. Right eye exhibits no discharge. Left eye exhibits no discharge. No scleral icterus.  Neck: Normal range of motion. Neck supple.  Cardiovascular: Normal rate, regular rhythm, normal heart sounds, intact distal pulses and normal pulses.   Pulmonary/Chest: Effort normal and breath sounds normal. No respiratory distress. He has no wheezes. He has no rales.  Abdominal: Soft. Normal appearance and bowel sounds are normal. He exhibits no distension and no mass. There is tenderness. There is no rigidity, no rebound and no guarding.  Minimal TTP in periumbilical region.  Musculoskeletal: Normal range of motion. He exhibits no edema or tenderness.  Neurological: He is alert and oriented to person, place, and time.  Skin: Skin is warm, dry and intact. No rash noted. He is not diaphoretic. No erythema. No pallor.  Psychiatric: He has a normal mood and affect. His speech is normal and behavior is normal.  Nursing note and vitals reviewed.   ED Course  Procedures (  including critical care time)  Labs Review Labs Reviewed  CBC WITH DIFFERENTIAL/PLATELET - Abnormal; Notable for the following:    RBC 5.90 (*)    Hemoglobin 17.9 (*)    MCHC 36.3 (*)    All other components within normal limits  COMPREHENSIVE METABOLIC PANEL - Abnormal; Notable for the following:    Chloride 100 (*)    ALT 16 (*)    All other components within normal limits  URINALYSIS, ROUTINE W REFLEX MICROSCOPIC (NOT AT [facility]) -  Abnormal; Notable for the following:    Color, Urine AMBER (*)    APPearance CLOUDY (*)    Bilirubin Urine SMALL (*)    Ketones, ur 15 (*)    All other components within normal limits  LIPASE, BLOOD    Imaging Review No results found.   I have personally reviewed and evaluated these lab results as part of my medical decision-making.   EKG Interpretation None      MDM   Final diagnoses:  Non-intractable vomiting with nausea, vomiting of unspecified type    25 year old male presents with N/V. Patient is afebrile. Vital signs stable. Abdomen soft, non-distended, with minimal TTP in periumbilical region. No rebound, guarding, or masses. Will obtain labs and give fluids and antiemetic.   Patient reports symptom improvement s/p medication administration. Requesting water and crackers.   CBC negative for leukocytosis or anemia. CMP unremarkable. Lipase within normal limits. UA negative for infection. Patient is nontoxic and well-appearing and is able to tolerate PO intake. Feel he is stable for discharge at this time. Symptoms likely viral. Will give zofran for home for nausea. Advised to increase fluid intake. Patient to follow-up with PCP. Return precautions discussed. Patient verbalizes his understanding and is in agreement with plan.  BP 124/92 mmHg  Pulse 71  Temp(Src) 98.5 F (36.9 C) (Oral)  Resp 18  Ht  (1.676 m)  Wt 65.318 kg  BMI 23.25 kg/m2  SpO2 100%     Mady Gemma, PA-C 03/08/16 1309  Azalia Bilis, MD 03/08/16 1408

## 2016-03-08 NOTE — Discharge Instructions (Signed)
1. Medications: zofran for nausea, usual home medications 2. Treatment: rest, drink plenty of fluids  3. Follow Up: please followup with your primary doctor for discussion of your diagnoses and further evaluation after today's visit; if you do not have a primary care doctor use the phone number listed in your discharge paperwork to find one; please return to the ER for high fever, persistent vomiting, new or worsening symptoms   Nausea and Vomiting Nausea means you feel sick to your stomach. Throwing up (vomiting) is a reflex where stomach contents come out of your mouth. HOME CARE   Take medicine as told by your doctor.  Do not force yourself to eat. However, you do need to drink fluids.  If you feel like eating, eat a normal diet as told by your doctor.  Eat rice, wheat, potatoes, bread, lean meats, yogurt, fruits, and vegetables.  Avoid high-fat foods.  Drink enough fluids to keep your pee (urine) clear or pale yellow.  Ask your doctor how to replace body fluid losses (rehydrate). Signs of body fluid loss (dehydration) include:  Feeling very thirsty.  Dry lips and mouth.  Feeling dizzy.  Dark pee.  Peeing less than normal.  Feeling confused.  Fast breathing or heart rate. GET HELP RIGHT AWAY IF:   You have blood in your throw up.  You have black or bloody poop (stool).  You have a bad headache or stiff neck.  You feel confused.  You have bad belly (abdominal) pain.  You have chest pain or trouble breathing.  You do not pee at least once every 8 hours.  You have cold, clammy skin.  You keep throwing up after 24 to 48 hours.  You have a fever. MAKE SURE YOU:   Understand these instructions.  Will watch your condition.  Will get help right away if you are not doing well or get worse.   This information is not intended to replace advice given to you by your health care provider. Make sure you discuss any questions you have with your health care  provider.   Document Released: 05/29/2008 Document Revised: 03/04/2012 Document Reviewed: 05/12/2011 Elsevier Interactive Patient Education [employer]2016 [employer] Inc.

## 2016-05-11 ENCOUNTER — Encounter (HOSPITAL_BASED_OUTPATIENT_CLINIC_OR_DEPARTMENT_OTHER): Payer: Self-pay | Admitting: *Deleted

## 2016-05-11 ENCOUNTER — Emergency Department (HOSPITAL_BASED_OUTPATIENT_CLINIC_OR_DEPARTMENT_OTHER)
Admission: EM | Admit: 2016-05-11 | Discharge: 2016-05-11 | Disposition: A | Discharge status: Home / Self Care | Payer: Medicaid Other | Attending: Emergency Medicine | Admitting: Emergency Medicine

## 2016-05-11 DIAGNOSIS — F17210 Nicotine dependence, cigarettes, uncomplicated: Secondary | ICD-10-CM | POA: Insufficient documentation

## 2016-05-11 DIAGNOSIS — F329 Major depressive disorder, single episode, unspecified: Secondary | ICD-10-CM | POA: Insufficient documentation

## 2016-05-11 DIAGNOSIS — K029 Dental caries, unspecified: Secondary | ICD-10-CM | POA: Insufficient documentation

## 2016-05-11 DIAGNOSIS — K0889 Other specified disorders of teeth and supporting structures: Secondary | ICD-10-CM

## 2016-05-11 MED ORDER — CLINDAMYCIN HCL 150 MG PO CAPS: 150.0000 mg | ORAL_CAPSULE | Freq: Four times a day (QID) | ORAL | Status: DC

## 2016-05-11 MED ORDER — DICLOFENAC SODIUM 50 MG PO TBEC: 50.0000 mg | DELAYED_RELEASE_TABLET | Freq: Two times a day (BID) | ORAL | Status: DC

## 2016-05-11 MED FILL — DICLOFENAC SOD EC 50 MG TAB: 50 | 7 days supply | Qty: 14 | Fill #0

## 2016-05-11 MED FILL — CLINDAMYCIN HCL 150 MG CAP: 150 | 7 days supply | Qty: 28 | Fill #0

## 2016-05-11 NOTE — ED Notes (Signed)
Pt reports tooth pain to right upper and lower mouth x 3 days.

## 2016-05-11 NOTE — ED Provider Notes (Signed)
CSN: [id number]     Arrival date & time 05/11/16  [id number] History   First MD Initiated Contact with Patient 05/11/16 [phone]     Chief Complaint  Patient presents with  . Dental Pain     (Consider location/radiation/quality/duration/timing/severity/associated sxs/prior Treatment) Patient is a 25 y.o. male presenting with tooth pain. The history is provided by the patient. No language interpreter was used.  Dental Pain Location:  Upper and lower Severity:  Severe Onset quality:  Gradual Timing:  Constant Progression:  Worsening Chronicity:  Recurrent Context: not abscess   Relieved by:  Nothing Worsened by:  Nothing tried Ineffective treatments:  None tried Pt went to dentl work.  He had 2 teeth extracted and both broke.    Past Medical History  Diagnosis Date  . Bronchitis   . Anxiety   . Depression   . PTSD (post-traumatic stress disorder)    History reviewed. No pertinent past surgical history. History reviewed. No pertinent family history. Social History  Substance Use Topics  . Smoking status: Current Every Day Smoker -- 1.00 packs/day for 10 years    Types: Cigarettes  . Smokeless tobacco: None  . Alcohol Use: No    Review of Systems  All other systems reviewed and are negative.     Allergies  Review of patient's allergies indicates no known allergies.  Home Medications   Prior to Admission medications   Medication Sig Start Date End Date Taking? Authorizing Provider  clindamycin (CLEOCIN) 150 MG capsule Take 1 capsule (150 mg total) by mouth every 6 (six) hours. 05/11/16   Elson AreasLeslie K Mubarak Bevens, PA-C  diclofenac (VOLTAREN) 50 MG EC tablet Take 1 tablet (50 mg total) by mouth 2 (two) times daily. 05/11/16   Elson AreasLeslie K Yanelle Sousa, PA-C   BP 129/88 mmHg  Pulse 66  Temp(Src) 97.8 F (36.6 C) (Oral)  Resp 16  Ht 5\' 6"  (1.676 m)  Wt 65.772 kg  BMI 23.41 kg/m2  SpO2 100% Physical Exam  Constitutional: He is oriented to person, place, and time. He appears well-developed and  well-nourished.  HENT:  Head: Normocephalic.  Severe decay, teeth has greenish color  Eyes: EOM are normal.  Neck: Normal range of motion.  Pulmonary/Chest: Effort normal.  Abdominal: He exhibits no distension.  Musculoskeletal: Normal range of motion.  Neurological: He is alert and oriented to person, place, and time.  Psychiatric: He has a normal mood and affect.  Nursing note and vitals reviewed.   ED Course  Procedures (including critical care time) Labs Review Labs Reviewed - No data to display  Imaging Review No results found. I have personally reviewed and evaluated these images and lab results as part of my medical decision-making.   EKG Interpretation None      MDM I advised pt I don't think medications are off much benefit.   He needs dental care, Pt given number for Dr. Leanord AsalFarless.    Final diagnoses:  Toothache    No current facility-administered medications for this encounter.  Current outpatient prescriptions:  .  clindamycin (CLEOCIN) 150 MG capsule, Take 1 capsule (150 mg total) by mouth every 6 (six) hours., Disp: 28 capsule, Rfl: 0 .  diclofenac (VOLTAREN) 50 MG EC tablet, Take 1 tablet (50 mg total) by mouth 2 (two) times daily., Disp: 14 tablet, Rfl: 0    Lonia SkinnerLeslie K BenningtonSofia, [place] JerseyPA-C 05/11/16 [id number]  Loren Raceravid Yelverton, MD 05/11/16 [phone]

## 2016-05-11 NOTE — Discharge Instructions (Signed)

## 2016-07-13 ENCOUNTER — Emergency Department (HOSPITAL_BASED_OUTPATIENT_CLINIC_OR_DEPARTMENT_OTHER)
Admission: EM | Admit: 2016-07-13 | Discharge: 2016-07-13 | Disposition: A | Discharge status: Home / Self Care | Payer: Medicaid Other | Attending: Emergency Medicine | Admitting: Emergency Medicine

## 2016-07-13 ENCOUNTER — Encounter (HOSPITAL_BASED_OUTPATIENT_CLINIC_OR_DEPARTMENT_OTHER): Payer: Self-pay | Admitting: Emergency Medicine

## 2016-07-13 DIAGNOSIS — Y939 Activity, unspecified: Secondary | ICD-10-CM | POA: Insufficient documentation

## 2016-07-13 DIAGNOSIS — Y929 Unspecified place or not applicable: Secondary | ICD-10-CM | POA: Insufficient documentation

## 2016-07-13 DIAGNOSIS — S060X0A Concussion without loss of consciousness, initial encounter: Secondary | ICD-10-CM | POA: Diagnosis not present

## 2016-07-13 DIAGNOSIS — F329 Major depressive disorder, single episode, unspecified: Secondary | ICD-10-CM | POA: Insufficient documentation

## 2016-07-13 DIAGNOSIS — R51 Headache: Secondary | ICD-10-CM | POA: Diagnosis present

## 2016-07-13 DIAGNOSIS — Y999 Unspecified external cause status: Secondary | ICD-10-CM | POA: Diagnosis not present

## 2016-07-13 DIAGNOSIS — F17210 Nicotine dependence, cigarettes, uncomplicated: Secondary | ICD-10-CM | POA: Insufficient documentation

## 2016-07-13 MED ORDER — NAPROXEN 500 MG PO TABS: 500.0000 mg | ORAL_TABLET | Freq: Two times a day (BID) | ORAL | Status: DC

## 2016-07-13 MED ORDER — ONDANSETRON 4 MG PO TBDP
4.0000 mg | ORAL_TABLET | Freq: Once | ORAL | Status: AC
  Administered 2016-07-13: 4 mg via ORAL
  Filled 2016-07-13: qty 1

## 2016-07-13 MED ORDER — KETOROLAC TROMETHAMINE 60 MG/2ML IM SOLN
60.0000 mg | Freq: Once | INTRAMUSCULAR | Status: AC
  Administered 2016-07-13: 60 mg via INTRAMUSCULAR
  Filled 2016-07-13: qty 2

## 2016-07-13 MED ORDER — OXYCODONE-ACETAMINOPHEN 5-325 MG PO TABS: 1.0000 | ORAL_TABLET | Freq: Four times a day (QID) | ORAL | Status: DC | PRN

## 2016-07-13 MED ORDER — ONDANSETRON 4 MG PO TBDP: 4.0000 mg | ORAL_TABLET | Freq: Three times a day (TID) | ORAL | Status: DC | PRN
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
## 2016-07-13 NOTE — ED Notes (Signed)
Pt given water to drink and ambulated around the dept. Pt states he felt dizzy when initially standing up.
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
## 2016-10-09 ENCOUNTER — Encounter (HOSPITAL_BASED_OUTPATIENT_CLINIC_OR_DEPARTMENT_OTHER): Payer: Self-pay | Admitting: *Deleted

## 2016-10-09 ENCOUNTER — Emergency Department (HOSPITAL_BASED_OUTPATIENT_CLINIC_OR_DEPARTMENT_OTHER)
Admission: EM | Admit: 2016-10-09 | Discharge: 2016-10-09 | Disposition: A | Discharge status: Home / Self Care | Payer: Self-pay | Attending: Emergency Medicine | Admitting: Emergency Medicine

## 2016-10-09 DIAGNOSIS — F17210 Nicotine dependence, cigarettes, uncomplicated: Secondary | ICD-10-CM | POA: Insufficient documentation

## 2016-10-09 DIAGNOSIS — K0889 Other specified disorders of teeth and supporting structures: Secondary | ICD-10-CM | POA: Insufficient documentation

## 2016-10-09 MED ORDER — NAPROXEN 500 MG PO TABS: 500.0000 mg | ORAL_TABLET | Freq: Two times a day (BID) | ORAL | 0 refills | Status: DC

## 2016-10-09 MED ORDER — PENICILLIN V POTASSIUM 500 MG PO TABS: 500.0000 mg | ORAL_TABLET | Freq: Four times a day (QID) | ORAL | 0 refills | Status: DC

## 2016-10-09 NOTE — ED Provider Notes (Signed)
[facility] Note   CSN: [id number] Arrival date & time: 10/09/16  [id number]  By signing my name below, I, Frank Salinas, attest that this documentation has been prepared under the direction and in the presence of Sharilyn SitesLisa Destony Prevost, PA-C. Electronically Signed: Linna Darnerussell Salinas, Scribe. 10/09/2016. 7:18 PM.  History   Chief Complaint Chief Complaint  Patient presents with  . Dental Pain    The history is provided by the patient. No language interpreter was used.     HPI Comments: Frank Salinas is a 25 y.o. male who presents to the Emergency Department complaining of constant, severe, throbbing, right-sided dental pain for the last few days. Pt reports his teeth are "awful" and notes he has dealt with the same dental pain for several months, but his pain worsened over the last several days. He is concerned for dental infection. Pt notes he does not have a regular dentist. He denies fever, chills, numbness, weakness, trouble swallowing, neck pain, or any other associated symptoms.  Past Medical History:  Diagnosis Date  . Anxiety   . Bronchitis   . Depression   . PTSD (post-traumatic stress disorder)     Patient Active Problem List   Diagnosis Date Noted  . Alcohol dependence (HCC) 05/28/2014  . Post traumatic stress disorder (PTSD) 05/28/2014    History reviewed. No pertinent surgical history.     Home Medications    Prior to Admission medications   Medication Sig Start Date End Date Taking? Authorizing Provider  clindamycin (CLEOCIN) 150 MG capsule Take 1 capsule (150 mg total) by mouth every 6 (six) hours. 05/11/16   Elson AreasLeslie K Sofia, PA-C  diclofenac (VOLTAREN) 50 MG EC tablet Take 1 tablet (50 mg total) by mouth 2 (two) times daily. 05/11/16   Elson AreasLeslie K Sofia, PA-C  naproxen (NAPROSYN) 500 MG tablet Take 1 tablet (500 mg total) by mouth 2 (two) times daily. 07/13/16   [name], PA-C  ondansetron (ZOFRAN ODT) 4 MG disintegrating tablet Take 1 tablet (4 mg  total) by mouth every 8 (eight) hours as needed for nausea or vomiting. 07/13/16   [name], PA-C  oxyCODONE-acetaminophen (PERCOCET/ROXICET) 5-325 MG tablet Take 1 tablet by mouth every 6 (six) hours as needed for severe pain. 07/13/16   [name], PA-C    Family History No family history on file.  Social History Social History  Substance Use Topics  . Smoking status: Current Every Day Smoker    Packs/day: 1.00    Years: 10.00    Types: Cigarettes  . Smokeless tobacco: Never Used  . Alcohol use No     Comment: former     Allergies   Review of patient's allergies indicates no known allergies.   Review of Systems Review of Systems  Constitutional: Negative for chills and fever.  HENT: Positive for dental problem (right side, top and bottom). Negative for trouble swallowing.   Musculoskeletal: Negative for neck pain.  Neurological: Negative for weakness and numbness.  All other systems reviewed and are negative.   Physical Exam Updated Vital Signs BP 128/81   Pulse 73   Temp 98.4 F (36.9 C) (Oral)   Resp 18   Ht 5\' 6"  (1.676 m)   Wt 145 lb (65.8 kg)   SpO2 100%   BMI 23.40 kg/m   Physical Exam  Constitutional: He is oriented to person, place, and time. He appears well-developed and well-nourished.  HENT:  Head: Normocephalic and atraumatic.  Mouth/Throat: Oropharynx is clear  and moist.  Teeth largely in extremely poor dentition, teeth are generally decayed with cavities throughout, right upper gums do appear swollen, right lower gums less swollen, handling secretions appropriately, no trismus, no facial or neck swelling, normal phonation without stridor  Eyes: Conjunctivae and EOM are normal. Pupils are equal, round, and reactive to light.  Neck: Normal range of motion.  Cardiovascular: Normal rate, regular rhythm and normal heart sounds.   Pulmonary/Chest: Effort normal and breath sounds normal. No respiratory distress. He has no wheezes.  Abdominal: Soft.  Bowel sounds are normal.  Musculoskeletal: Normal range of motion.  Neurological: He is alert and oriented to person, place, and time.  Skin: Skin is warm and dry.  Psychiatric: He has a normal mood and affect.  Nursing note and vitals reviewed.   ED Treatments / Results  Labs (all labs ordered are listed, but only abnormal results are displayed) Labs Reviewed - No data to display  EKG  EKG Interpretation None       Radiology No results found.  Procedures Procedures (including critical care time)  DIAGNOSTIC STUDIES: Oxygen Saturation is 100% on RA, normal by my interpretation.    COORDINATION OF CARE: 7:24 PM Discussed treatment plan with pt at bedside and pt agreed to plan.  Medications Ordered in ED Medications - No data to display   Initial Impression / Assessment and Plan / ED Course  I have reviewed the triage vital signs and the nursing notes.  Pertinent labs & imaging results that were available during my care of the patient were reviewed by me and considered in my medical decision making (see chart for details).  Clinical Course   25 year old male here with dental pain. He has extremely poor dentition, teeth are generally decayed with cavities throughout. His right upper gums do appear swollen without drainable fluid collection. He has no facial or neck swelling. Handling secretions well. Not clinically concerning for Ludwig's angina. Patient is high-risk for dental infection however given the current state of his teeth. Will start on antibiotics and referred to dentist.  Discussed plan with patient, he acknowledged understanding and agreed with plan of care.  Return precautions given for new or worsening symptoms.  I personally performed the services described in this documentation, which was scribed in my presence. The recorded information has been reviewed and is accurate. Final Clinical Impressions(s) / ED Diagnoses   Final diagnoses:  Pain, dental     New Prescriptions Discharge Medication List as of 10/09/2016  7:37 PM    START taking these medications   Details  penicillin v potassium (VEETID) 500 MG tablet Take 1 tablet (500 mg total) by mouth 4 (four) times daily., Starting Mon 10/09/2016, Print         Garlon Hatchet, PA-C 10/09/16 2012    Lavera Guise, MD 10/10/16 [phone]

## 2016-10-09 NOTE — ED Triage Notes (Signed)
Pt c/o dental pain right side of mouth, worse since saturday

## 2016-10-09 NOTE — ED Notes (Signed)
Pt made aware to return if symptoms worsen or if any life threatening symptoms occur.   

## 2016-10-09 NOTE — Discharge Instructions (Signed)
Take the prescribed medication as directed. Follow-up with dentist.  Dr. Russella DarBenitez is on call, or you may follow-up with one of the local clinics. Return to the ED for new or worsening symptoms.

## 2017-02-05 ENCOUNTER — Encounter (HOSPITAL_BASED_OUTPATIENT_CLINIC_OR_DEPARTMENT_OTHER): Payer: Self-pay | Admitting: *Deleted

## 2017-02-05 ENCOUNTER — Emergency Department (HOSPITAL_BASED_OUTPATIENT_CLINIC_OR_DEPARTMENT_OTHER)
Admission: EM | Admit: 2017-02-05 | Discharge: 2017-02-05 | Disposition: A | Discharge status: Home / Self Care | Payer: Self-pay | Attending: Emergency Medicine | Admitting: Emergency Medicine

## 2017-02-05 DIAGNOSIS — R197 Diarrhea, unspecified: Secondary | ICD-10-CM | POA: Insufficient documentation

## 2017-02-05 DIAGNOSIS — R6883 Chills (without fever): Secondary | ICD-10-CM | POA: Insufficient documentation

## 2017-02-05 DIAGNOSIS — F17210 Nicotine dependence, cigarettes, uncomplicated: Secondary | ICD-10-CM | POA: Insufficient documentation

## 2017-02-05 DIAGNOSIS — R112 Nausea with vomiting, unspecified: Secondary | ICD-10-CM | POA: Insufficient documentation

## 2017-02-05 DIAGNOSIS — R1013 Epigastric pain: Secondary | ICD-10-CM | POA: Insufficient documentation

## 2017-02-05 MED ORDER — BISMUTH SUBSALICYLATE 262 MG/15ML PO SUSP
30.0000 mL | Freq: Once | ORAL | Status: DC
  Filled 2017-02-05: qty 118

## 2017-02-05 MED ORDER — ONDANSETRON 4 MG PO TBDP
4.0000 mg | ORAL_TABLET | Freq: Once | ORAL | Status: AC
  Administered 2017-02-05: 4 mg via ORAL
  Filled 2017-02-05: qty 1

## 2017-02-05 NOTE — ED Provider Notes (Signed)
[facility]   CSN: [id number] Arrival date & time: 02/05/17  1531  By signing my name below, I, Frank Salinas, attest that this documentation has been prepared under the direction and in the presence of [name], MD. Electronically Signed: Rosario AdieWilliam Andrew Salinas, ED Scribe. 02/05/17. 6:43 PM.  History   Chief Complaint Chief Complaint  Patient presents with  . Vomiting   The history is provided by the patient. No language interpreter was used.  Emesis   This is a new problem. The current episode started 3 to 5 hours ago. The problem occurs 5 to 10 times per day. The problem has been gradually improving. There has been no fever. Associated symptoms include abdominal pain (upper, soreness 2/2 vomiting), chills and diarrhea. Pertinent negatives include no fever.    HPI Comments: Frank Salinas is a 26 y.o. male with a h/o alcohol dependence, who presents to the Emergency Department complaining of persistent nausea and intermittent vomiting beginning this afternoon. Pt reports associated diarrhea, generalized myalgias, chills, and upper abdominal soreness secondary to vomiting. No GERD or heartburn. He quantifies his episodes of emesis at six and his episode of diarrhea at two. Stool is describes as watery. No sick contacts, recent travel, antibiotic usage. He notes that prior to the onset of his symptoms that he ate grocery store barbeque which he purchased two days ago, and it did not leave his food out of the refrigerator while storing. He also ate some barbeque slaw with this. No treatments for his symptoms were tried prior to coming into the ED; however, after resting while in the ED he states that he feels as if his symptoms have improved. Pt is a recovering alcoholic and notes that his last drink was approximately one year ago. He denies fever, or any other associated symptoms.   Past Medical History:  Diagnosis Date  . Anxiety   . Bronchitis   .  Depression   . PTSD (post-traumatic stress disorder)    Patient Active Problem List   Diagnosis Date Noted  . Alcohol dependence (HCC) 05/28/2014  . Post traumatic stress disorder (PTSD) 05/28/2014   No past surgical history on file.  Home Medications    Prior to Admission medications   Medication Sig Start Date End Date Taking? Authorizing Provider  clindamycin (CLEOCIN) 150 MG capsule Take 1 capsule (150 mg total) by mouth every 6 (six) hours. 05/11/16   Elson AreasLeslie K Sofia, PA-C  diclofenac (VOLTAREN) 50 MG EC tablet Take 1 tablet (50 mg total) by mouth 2 (two) times daily. 05/11/16   Elson AreasLeslie K Sofia, PA-C  naproxen (NAPROSYN) 500 MG tablet Take 1 tablet (500 mg total) by mouth 2 (two) times daily with a meal. 10/09/16   Garlon HatchetLisa M Sanders, PA-C  ondansetron (ZOFRAN ODT) 4 MG disintegrating tablet Take 1 tablet (4 mg total) by mouth every 8 (eight) hours as needed for nausea or vomiting. 07/13/16   [name], PA-C  oxyCODONE-acetaminophen (PERCOCET/ROXICET) 5-325 MG tablet Take 1 tablet by mouth every 6 (six) hours as needed for severe pain. 07/13/16   [name], PA-C  penicillin v potassium (VEETID) 500 MG tablet Take 1 tablet (500 mg total) by mouth 4 (four) times daily. 10/09/16   Garlon HatchetLisa M Sanders, PA-C   Family History No family history on file.  Social History Social History  Substance Use Topics  . Smoking status: Current Every Day Smoker    Packs/day: 1.00    Years: 10.00  Types: Cigarettes  . Smokeless tobacco: Never Used  . Alcohol use No     Comment: former   Allergies   Patient has no known allergies.  Review of Systems Review of Systems  Constitutional: Positive for chills. Negative for fever.  Gastrointestinal: Positive for abdominal pain (upper, soreness 2/2 vomiting), diarrhea, nausea and vomiting.  All other systems reviewed and are negative.  Physical Exam Updated Vital Signs BP 108/65 (BP Location: Right Arm)   Pulse (!) 50   Temp 98 F (36.7 C) (Oral)    Resp 16   Ht 5\' 6"  (1.676 m)   Wt 140 lb (63.5 kg)   SpO2 100%   BMI 22.60 kg/m   Physical Exam  Constitutional: He is oriented to person, place, and time. He appears well-developed and well-nourished. No distress.  HENT:  Head: Normocephalic and atraumatic.  Nose: Nose normal.  Eyes: Conjunctivae are normal.  Neck: Neck supple. No tracheal deviation present.  Cardiovascular: Normal rate, regular rhythm, S1 normal, S2 normal and normal heart sounds.  Exam reveals no gallop and no friction rub.   No murmur heard. Pulmonary/Chest: Effort normal and breath sounds normal. No respiratory distress. He has no wheezes. He has no rales.  Abdominal: Soft. He exhibits no distension. There is tenderness.  Mild epigastric tenderness. No rebound or guarding.   Neurological: He is alert and oriented to person, place, and time.  Skin: Skin is warm and dry.  Psychiatric: He has a normal mood and affect.  Nursing note and vitals reviewed.  ED Treatments / Results  DIAGNOSTIC STUDIES: Oxygen Saturation is 100% on RA, normal by my interpretation.   COORDINATION OF CARE: 6:43 PM-Discussed next steps with pt. Pt verbalized understanding and is agreeable with the plan.   Labs (all labs ordered are listed, but only abnormal results are displayed) Labs Reviewed - No data to display  EKG  EKG Interpretation None      Radiology No results found.  Procedures Procedures   Medications Ordered in ED Medications  bismuth subsalicylate (PEPTO BISMOL) 262 MG/15ML suspension 30 mL (not administered)  ondansetron (ZOFRAN-ODT) disintegrating tablet 4 mg (not administered)   Initial Impression / Assessment and Plan / ED Course  I have reviewed the triage vital signs and the nursing notes.  Pertinent labs & imaging results that were available during my care of the patient were reviewed by me and considered in my medical decision making (see chart for details).     Patient presents with symptoms  c/w a viral gastroenteritis (vomiting, diarrhea) for 1 day. Patient appears well. No signs of toxicity. Not in distress. No signs of clinical dehydration. Doubt appendicitis, and no evidence of any other illness. Discussed symptomatic treatment and they will follow closely with their PCP.   Final Clinical Impressions(s) / ED Diagnoses   Final diagnoses:  Nausea vomiting and diarrhea   New Prescriptions New Prescriptions   No medications on file   I personally performed the services described in this documentation, which was scribed in my presence. The recorded information has been reviewed and is accurate.     Lyndal Pulley, MD 02/05/17 [phone]

## 2017-02-05 NOTE — ED Triage Notes (Signed)
Pt. Reports 3 episodes of vomiting and 2 diarrhea stools.  Pt. Had BBQ same as he had eaten on Sat. Night with no problems.  NO alcohol. Recovering Alcoholic.  Last drink of Alcohol was approx. 1 year ago.

## 2017-02-27 ENCOUNTER — Encounter (HOSPITAL_BASED_OUTPATIENT_CLINIC_OR_DEPARTMENT_OTHER): Payer: Self-pay | Admitting: Emergency Medicine

## 2017-02-27 ENCOUNTER — Emergency Department (HOSPITAL_BASED_OUTPATIENT_CLINIC_OR_DEPARTMENT_OTHER)
Admission: EM | Admit: 2017-02-27 | Discharge: 2017-02-27 | Disposition: A | Discharge status: Home / Self Care | Payer: Self-pay | Attending: Emergency Medicine | Admitting: Emergency Medicine

## 2017-02-27 DIAGNOSIS — K029 Dental caries, unspecified: Secondary | ICD-10-CM | POA: Insufficient documentation

## 2017-02-27 DIAGNOSIS — F17210 Nicotine dependence, cigarettes, uncomplicated: Secondary | ICD-10-CM | POA: Insufficient documentation

## 2017-02-27 HISTORY — DX: Dental caries, unspecified: K02.9

## 2017-02-27 HISTORY — DX: Other chronic pain: G89.29

## 2017-02-27 HISTORY — DX: Disorder of teeth and supporting structures, unspecified: K08.9

## 2017-02-27 MED ORDER — AMOXICILLIN 500 MG PO CAPS: 500.0000 mg | ORAL_CAPSULE | Freq: Three times a day (TID) | ORAL | 0 refills | Status: DC

## 2017-02-27 MED ORDER — IBUPROFEN 800 MG PO TABS: 800.0000 mg | ORAL_TABLET | Freq: Three times a day (TID) | ORAL | 0 refills | Status: DC

## 2017-02-27 NOTE — ED Triage Notes (Signed)
Pt c/o tooth pain x about a month

## 2017-02-27 NOTE — ED Provider Notes (Signed)
[facility]   CSN: [id number] Arrival date & time: 02/27/17  2009   By signing my name below, I, Freida Busmaniana Omoyeni, attest that this documentation has been prepared under the direction and in the presence of Tilden FossaElizabeth Mayela Bullard, MD . Electronically Signed: Freida Busmaniana Omoyeni, Scribe. 02/27/2017. 9:14 PM.   History   Chief Complaint Chief Complaint  Patient presents with  . Dental Pain    The history is provided by the patient. No language interpreter was used.     HPI Comments:  Frank Salinas is a 26 y.o. male with a history of chronic dental pain and dental caries, who presents to the Emergency Department complaining of left front dental pain that has been worse over the last couple of days. The tooth for which he has pain is broken. No alleviating factors noted. He denies fever and difficulty swallowing. He does not currently have a dentist as he lost his dental insurance when he lost his job.   Past Medical History:  Diagnosis Date  . Anxiety   . Bronchitis   . Chronic dental pain   . Dental caries   . Depression   . PTSD (post-traumatic stress disorder)     Patient Active Problem List   Diagnosis Date Noted  . Alcohol dependence (HCC) 05/28/2014  . Post traumatic stress disorder (PTSD) 05/28/2014    History reviewed. No pertinent surgical history.     Home Medications    Prior to Admission medications   Medication Sig Start Date End Date Taking? Authorizing Provider  amoxicillin (AMOXIL) 500 MG capsule Take 1 capsule (500 mg total) by mouth 3 (three) times daily. 02/27/17   Tilden FossaElizabeth Annessa Satre, MD  clindamycin (CLEOCIN) 150 MG capsule Take 1 capsule (150 mg total) by mouth every 6 (six) hours. 05/11/16   Elson AreasLeslie K Sofia, PA-C  diclofenac (VOLTAREN) 50 MG EC tablet Take 1 tablet (50 mg total) by mouth 2 (two) times daily. 05/11/16   Elson AreasLeslie K Sofia, PA-C  ibuprofen (ADVIL,MOTRIN) 800 MG tablet Take 1 tablet (800 mg total) by mouth 3 (three) times daily. 02/27/17    Tilden FossaElizabeth Tristyn Demarest, MD  naproxen (NAPROSYN) 500 MG tablet Take 1 tablet (500 mg total) by mouth 2 (two) times daily with a meal. 10/09/16   Garlon HatchetLisa M Sanders, PA-C  ondansetron (ZOFRAN ODT) 4 MG disintegrating tablet Take 1 tablet (4 mg total) by mouth every 8 (eight) hours as needed for nausea or vomiting. 07/13/16   [name], PA-C  oxyCODONE-acetaminophen (PERCOCET/ROXICET) 5-325 MG tablet Take 1 tablet by mouth every 6 (six) hours as needed for severe pain. 07/13/16   [name], PA-C  penicillin v potassium (VEETID) 500 MG tablet Take 1 tablet (500 mg total) by mouth 4 (four) times daily. 10/09/16   Garlon HatchetLisa M Sanders, PA-C    Family History No family history on file.  Social History Social History  Substance Use Topics  . Smoking status: Current Every Day Smoker    Packs/day: 1.00    Years: 10.00    Types: Cigarettes  . Smokeless tobacco: Never Used  . Alcohol use No     Comment: former     Allergies   Patient has no known allergies.   Review of Systems Review of Systems  Constitutional: Negative for fever.  HENT: Positive for dental problem. Negative for facial swelling and trouble swallowing.      Physical Exam Updated Vital Signs BP 114/77 (BP Location: Left Arm)   Pulse 73  Temp 98.4 F (36.9 C) (Oral)   Resp 18   Ht 5\' 6"  (1.676 m)   Wt 140 lb (63.5 kg)   SpO2 100%   BMI 22.60 kg/m   Physical Exam  Constitutional: He is oriented to person, place, and time. He appears well-developed and well-nourished.  HENT:  Head: Normocephalic and atraumatic.  Right Ear: External ear normal.  Left Ear: External ear normal.  Poor dentition with extensive dental carries.  No swelling in OP  Eyes: EOM are normal. Pupils are equal, round, and reactive to light.  Cardiovascular: Normal rate and regular rhythm.   No murmur heard. Pulmonary/Chest: Effort normal and breath sounds normal. No respiratory distress.  Musculoskeletal: He exhibits no edema or tenderness.    Neurological: He is alert and oriented to person, place, and time.  Skin: Skin is warm and dry.  Psychiatric: He has a normal mood and affect. His behavior is normal.  Nursing note and vitals reviewed.    ED Treatments / Results  DIAGNOSTIC STUDIES:  Oxygen Saturation is 100% on RA, normal by my interpretation.    COORDINATION OF CARE:  9:14 PM Pt advised to take ibuprofen 800 three times a day.  Discussed treatment plan with pt at bedside and pt agreed to plan.  Labs (all labs ordered are listed, but only abnormal results are displayed) Labs Reviewed - No data to display  EKG  EKG Interpretation None       Radiology No results found.  Procedures Procedures (including critical care time)  Medications Ordered in ED Medications - No data to display   Initial Impression / Assessment and Plan / ED Course  I have reviewed the triage vital signs and the nursing notes.  Pertinent labs & imaging results that were available during my care of the patient were reviewed by me and considered in my medical decision making (see chart for details).     Pt here for evaluation of dental pain. He is nontoxic appearing on examination with extensive dental caries with no evidence of abscess. Discussed with patient home care for dental pain with NSAIDs. Providing perception for antibiotics if he is to develop swelling. Discussed dentistry follow-up as well as home care and return precautions.  Final Clinical Impressions(s) / ED Diagnoses   Final diagnoses:  Pain due to dental caries    New Prescriptions Discharge Medication List as of 02/27/2017  9:19 PM    START taking these medications   Details  amoxicillin (AMOXIL) 500 MG capsule Take 1 capsule (500 mg total) by mouth 3 (three) times daily., Starting Tue 02/27/2017, Print    ibuprofen (ADVIL,MOTRIN) 800 MG tablet Take 1 tablet (800 mg total) by mouth 3 (three) times daily., Starting Tue 02/27/2017, Print      I personally  performed the services described in this documentation, which was scribed in my presence. The recorded information has been reviewed and is accurate.    Tilden Fossa, MD 02/27/17 [phone]

## 2017-04-01 ENCOUNTER — Ambulatory Visit (HOSPITAL_COMMUNITY)
Admission: AD | Admit: 2017-04-01 | Discharge: 2017-04-01 | Disposition: A | Discharge status: Home / Self Care | Payer: Self-pay | Attending: Psychiatry | Admitting: Psychiatry

## 2017-04-01 DIAGNOSIS — R69 Illness, unspecified: Secondary | ICD-10-CM | POA: Insufficient documentation

## 2017-04-02 NOTE — H&P (Signed)
Behavioral Health Medical Screening Exam  Frank Salinas is an 26 y.o. male.  Total Time spent with patient: 15 minutes  Psychiatric Specialty Exam: Physical Exam  Constitutional: He is oriented to person, place, and time. He appears well-developed and well-nourished. No distress.  HENT:  Head: Normocephalic and atraumatic.  Right Ear: External ear normal.  Left Ear: External ear normal.  Eyes: Pupils are equal, round, and reactive to light. Right eye exhibits no discharge. Left eye exhibits no discharge. Right conjunctiva is injected. Left conjunctiva is injected. No scleral icterus.  Neck: Normal range of motion.  Cardiovascular: Normal rate, regular rhythm and normal heart sounds.   Respiratory: Effort normal and breath sounds normal. No respiratory distress.  Musculoskeletal: Normal range of motion.  Neurological: He is alert and oriented to person, place, and time.  Skin: Skin is warm and dry. He is not diaphoretic.    Review of Systems  Constitutional: Negative.  Negative for chills, diaphoresis, fever, malaise/fatigue and weight loss.  HENT: Negative.   Eyes: Negative.   Respiratory: Positive for cough and sputum production. Negative for hemoptysis, shortness of breath and wheezing.   Cardiovascular: Negative.   Gastrointestinal: Positive for diarrhea and vomiting.  Genitourinary: Negative.   Musculoskeletal: Negative.   Skin: Negative.   Neurological: Negative.  Negative for weakness.  Endo/Heme/Allergies: Negative.   Psychiatric/Behavioral: Negative.     Blood pressure 119/80, pulse 79, temperature 97.8 F (36.6 C), temperature source Oral, resp. rate 18, SpO2 99 %.There is no height or weight on file to calculate BMI.  General Appearance: Fairly Groomed  Eye Contact:  Good  Speech:  Clear and Coherent and Normal Rate  Volume:  Normal  Mood:  Anxious, Depressed, Hopeless, Irritable and Worthless  Affect:  Congruent  Thought Process:  Coherent and Goal Directed   Orientation:  Full (Time, Place, and Person)  Thought Content:  WDL  Suicidal Thoughts:  No  Homicidal Thoughts:  No  Memory:  Immediate;   Good Recent;   Good Remote;   Good  Judgement:  Fair  Insight:  Fair  Psychomotor Activity:  Normal  Concentration: Concentration: Good and Attention Span: Good  Recall:  Good  Fund of Knowledge:Good  Language: Good  Akathisia:  No  Handed:  Right  AIMS (if indicated):     Assets:  Communication Skills Desire for Improvement Financial Resources/Insurance Housing Intimacy Leisure Time Physical Health  Sleep:       Musculoskeletal: Strength & Muscle Tone: within normal limits Gait & Station: normal   Blood pressure 119/80, pulse 79, temperature 97.8 F (36.6 C), temperature source Oral, resp. rate 18, SpO2 99 %.  Recommendations:  Based on my evaluation the patient does not appear to have an emergency medical condition.  Jackelyn Poling, NP 04/02/2017, 1:58 AM
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
## 2017-09-03 ENCOUNTER — Emergency Department (HOSPITAL_BASED_OUTPATIENT_CLINIC_OR_DEPARTMENT_OTHER)
Admission: EM | Admit: 2017-09-03 | Discharge: 2017-09-03 | Disposition: A | Discharge status: Home / Self Care | Payer: Self-pay | Attending: Emergency Medicine | Admitting: Emergency Medicine

## 2017-09-03 ENCOUNTER — Encounter (HOSPITAL_BASED_OUTPATIENT_CLINIC_OR_DEPARTMENT_OTHER): Payer: Self-pay | Admitting: *Deleted

## 2017-09-03 DIAGNOSIS — Z79899 Other long term (current) drug therapy: Secondary | ICD-10-CM | POA: Insufficient documentation

## 2017-09-03 DIAGNOSIS — F17210 Nicotine dependence, cigarettes, uncomplicated: Secondary | ICD-10-CM | POA: Insufficient documentation

## 2017-09-03 DIAGNOSIS — K029 Dental caries, unspecified: Secondary | ICD-10-CM | POA: Insufficient documentation

## 2017-09-03 MED ORDER — HYDROCODONE-ACETAMINOPHEN 5-325 MG PO TABS: 1.0000 | ORAL_TABLET | Freq: Four times a day (QID) | ORAL | 0 refills | Status: DC | PRN

## 2017-09-03 MED ORDER — BUPIVACAINE-EPINEPHRINE (PF) 0.5% -1:200000 IJ SOLN
INTRAMUSCULAR | Status: AC
  Administered 2017-09-03: 1.8 mL
  Filled 2017-09-03: qty 1.8

## 2017-09-03 MED ORDER — PENICILLIN V POTASSIUM 250 MG PO TABS: 250.0000 mg | ORAL_TABLET | Freq: Four times a day (QID) | ORAL | 0 refills | Status: AC

## 2017-09-03 MED FILL — PENICILLIN VK 250 MG TABLET: 250 | 10 days supply | Qty: 40 | Fill #0

## 2017-09-03 MED FILL — HYDROCODON-APAP 5-325: 5-325 | 4 days supply | Qty: 15 | Fill #0

## 2017-09-03 NOTE — ED Triage Notes (Signed)
Pt reports right side dental pain x 1 week. Has multiple broken teeth. States has no dental insurnace

## 2017-09-03 NOTE — ED Notes (Signed)
ED Provider at bedside. Dr. Tanna SavoyPlunket

## 2017-09-03 NOTE — ED Provider Notes (Signed)
[facility] Provider [facility]   CSN: [id number] Arrival date & time: 09/03/17  [id number]     History   Chief Complaint Chief Complaint  Patient presents with  . Dental Pain    HPI Frank Salinas is a 26 y.o. male.  The history is provided by the patient.  Dental Pain   This is a recurrent problem. The current episode started more than 1 week ago. The problem occurs constantly. The problem has been rapidly worsening. The pain is at a severity of 6/10. The pain is severe. Treatments tried: goody's powder. The treatment provided no relief.    Past Medical History:  Diagnosis Date  . Anxiety   . Bronchitis   . Chronic dental pain   . Dental caries   . Depression   . PTSD (post-traumatic stress disorder)     Patient Active Problem List   Diagnosis Date Noted  . Alcohol dependence (HCC) 05/28/2014  . Post traumatic stress disorder (PTSD) 05/28/2014    History reviewed. No pertinent surgical history.     Home Medications    Prior to Admission medications   Medication Sig Start Date End Date Taking? Authorizing Provider  amoxicillin (AMOXIL) 500 MG capsule Take 1 capsule (500 mg total) by mouth 3 (three) times daily. 02/27/17   Tilden Fossaees, Elizabeth, MD  clindamycin (CLEOCIN) 150 MG capsule Take 1 capsule (150 mg total) by mouth every 6 (six) hours. 05/11/16   Elson AreasSofia, Leslie K, PA-C  diclofenac (VOLTAREN) 50 MG EC tablet Take 1 tablet (50 mg total) by mouth 2 (two) times daily. 05/11/16   Elson AreasSofia, Leslie K, PA-C  HYDROcodone-acetaminophen (NORCO/VICODIN) 5-325 MG tablet Take 1 tablet by mouth every 6 (six) hours as needed for severe pain. 09/03/17   Gwyneth SproutPlunkett, Tabia Landowski, MD  ibuprofen (ADVIL,MOTRIN) 800 MG tablet Take 1 tablet (800 mg total) by mouth 3 (three) times daily. 02/27/17   Tilden Fossaees, Elizabeth, MD  naproxen (NAPROSYN) 500 MG tablet Take 1 tablet (500 mg total) by mouth 2 (two) times daily with a meal. 10/09/16   Garlon HatchetSanders, Lisa M, PA-C  ondansetron (ZOFRAN ODT) 4 MG disintegrating  tablet Take 1 tablet (4 mg total) by mouth every 8 (eight) hours as needed for nausea or vomiting. 07/13/16   Joy, Shawn C, PA-C  oxyCODONE-acetaminophen (PERCOCET/ROXICET) 5-325 MG tablet Take 1 tablet by mouth every 6 (six) hours as needed for severe pain. 07/13/16   Joy, Shawn C, PA-C  penicillin v potassium (VEETID) 250 MG tablet Take 1 tablet (250 mg total) by mouth 4 (four) times daily. 09/03/17 09/10/17  Gwyneth SproutPlunkett, Albaraa Swingle, MD    Family History No family history on file.  Social History Social History  Substance Use Topics  . Smoking status: Current Every Day Smoker    Packs/day: 1.00    Years: 10.00    Types: Cigarettes  . Smokeless tobacco: Never Used  . Alcohol use No     Comment: former     Allergies   Patient has no known allergies.   Review of Systems Review of Systems  All other systems reviewed and are negative.    Physical Exam Updated Vital Signs BP (!) 140/91 (BP Location: Right Arm)   Pulse 74   Temp 98.1 F (36.7 C) (Oral)   Resp 16   Ht 5\' 6"  (1.676 m)   Wt 77.1 kg (170 lb)   SpO2 99%   BMI 27.44 kg/m   Physical Exam  Constitutional: He is oriented to person, place, and time. He appears  well-developed and well-nourished.  HENT:  Global dental decay which is severe. No localized abscess or facial swelling. No uvular swelling or difficulty swallowing.  Eyes: Pupils are equal, round, and reactive to light. EOM are normal.  Neck: Normal range of motion.  Cardiovascular: Normal rate.   Pulmonary/Chest: Effort normal.  Neurological: He is alert and oriented to person, place, and time.  Skin: Skin is warm and dry.  Psychiatric: He has a normal mood and affect. His behavior is normal.  Nursing note and vitals reviewed.    ED Treatments / Results  Labs (all labs ordered are listed, but only abnormal results are displayed) Labs Reviewed - No data to display  EKG  EKG Interpretation None       Radiology No results  found.  Procedures Dental Block Date/Time: 09/03/2017 9:33 AM Performed by: Gwyneth Sprout Authorized by: Gwyneth Sprout   Consent:    Consent obtained:  Verbal   Consent given by:  Patient   Risks discussed:  Pain and unsuccessful block   Alternatives discussed:  No treatment Indications:    Indications: dental pain   Location:    Block type:  Supraperiosteal   Supraperiosteal location:  Upper teeth   Upper teeth location:  2/RU 2nd molar Procedure details (see MAR for exact dosages):    Needle gauge:  27 G   Anesthetic injected:  Bupivacaine 0.25% WITH epi   Injection procedure:  Anatomic landmarks identified, introduced needle and negative aspiration for blood Post-procedure details:    Outcome:  Pain relieved   Patient tolerance of procedure:  Tolerated well, no immediate complications   (including critical care time)  Medications Ordered in ED Medications  bupivacaine-epinephrine (MARCAINE W/ EPI) 0.5% -1:200000 injection (1.8 mLs  Given by Other 09/03/17 0915)     Initial Impression / Assessment and Plan / ED Course  I have reviewed the triage vital signs and the nursing notes.  Pertinent labs & imaging results that were available during my care of the patient were reviewed by me and considered in my medical decision making (see chart for details).     Pt with dental caries and no facial swelling.  No signs of ludwig's angina or difficulty swallowing and no systemic symptoms. Will treat with PCN and have pt f/u with dentist.   Final Clinical Impressions(s) / ED Diagnoses   Final diagnoses:  Dental caries    New Prescriptions New Prescriptions   HYDROCODONE-ACETAMINOPHEN (NORCO/VICODIN) 5-325 MG TABLET    Take 1 tablet by mouth every 6 (six) hours as needed for severe pain.   PENICILLIN V POTASSIUM (VEETID) 250 MG TABLET    Take 1 tablet (250 mg total) by mouth 4 (four) times daily.     Gwyneth Sprout, MD 09/03/17 [id number]

## 2017-11-22 ENCOUNTER — Encounter (HOSPITAL_BASED_OUTPATIENT_CLINIC_OR_DEPARTMENT_OTHER): Payer: Self-pay | Admitting: Emergency Medicine

## 2017-11-22 ENCOUNTER — Other Ambulatory Visit: Payer: Self-pay

## 2017-11-22 ENCOUNTER — Emergency Department (HOSPITAL_BASED_OUTPATIENT_CLINIC_OR_DEPARTMENT_OTHER)
Admission: EM | Admit: 2017-11-22 | Discharge: 2017-11-22 | Disposition: A | Discharge status: Home / Self Care | Payer: Self-pay | Attending: Emergency Medicine | Admitting: Emergency Medicine

## 2017-11-22 DIAGNOSIS — R519 Headache, unspecified: Secondary | ICD-10-CM

## 2017-11-22 DIAGNOSIS — Z79899 Other long term (current) drug therapy: Secondary | ICD-10-CM | POA: Insufficient documentation

## 2017-11-22 DIAGNOSIS — F17210 Nicotine dependence, cigarettes, uncomplicated: Secondary | ICD-10-CM | POA: Insufficient documentation

## 2017-11-22 DIAGNOSIS — R51 Headache: Secondary | ICD-10-CM | POA: Insufficient documentation

## 2017-11-22 MED ORDER — KETOROLAC TROMETHAMINE 30 MG/ML IJ SOLN
30.0000 mg | Freq: Once | INTRAMUSCULAR | Status: AC
  Administered 2017-11-22: 30 mg via INTRAVENOUS
  Filled 2017-11-22: qty 1

## 2017-11-22 MED ORDER — SODIUM CHLORIDE 0.9 % IV BOLUS (SEPSIS)
1000.0000 mL | Freq: Once | INTRAVENOUS | Status: AC
  Administered 2017-11-22: 1000 mL via INTRAVENOUS

## 2017-11-22 MED ORDER — METOCLOPRAMIDE HCL 5 MG/ML IJ SOLN
10.0000 mg | Freq: Once | INTRAMUSCULAR | Status: AC
  Administered 2017-11-22: 10 mg via INTRAVENOUS
  Filled 2017-11-22: qty 2

## 2017-11-22 MED ORDER — ONDANSETRON HCL 4 MG/2ML IJ SOLN
4.0000 mg | Freq: Once | INTRAMUSCULAR | Status: AC
  Administered 2017-11-22: 4 mg via INTRAVENOUS
  Filled 2017-11-22: qty 2

## 2017-11-22 MED ORDER — DIPHENHYDRAMINE HCL 50 MG/ML IJ SOLN
25.0000 mg | Freq: Once | INTRAMUSCULAR | Status: AC
  Administered 2017-11-22: 25 mg via INTRAVENOUS
  Filled 2017-11-22: qty 1

## 2017-11-22 NOTE — Discharge Instructions (Signed)
Please read instructions below. °You can take 600 mg of Advil/ibuprofen every 6 hours as needed for headache. °Schedule an appointment with your primary care provider to follow up on your headache and discuss preventative treatment. °Return to the ER for severely worsening headache, vision changes, fever, weakness or numbness, or new or concerning symptoms. ° °

## 2017-11-22 NOTE — ED Triage Notes (Signed)
Patient states that he has had a headache for 2 weeks. The patient denies any hx of M hx in the past

## 2017-11-22 NOTE — ED Provider Notes (Signed)
[facility] EMERGENCY DEPARTMENT Provider Note   CSN: [id number] Arrival date & time: 11/22/17  1830     History   Chief Complaint Chief Complaint  Patient presents with  . Migraine    HPI Frank Salinas is a 26 y.o. male presenting to the ED with persistent HA x 2 weeks.  States headache is throbbing in nature with associated photophobia and nausea.  Reports history of similar headaches.  No recent head trauma, fevers, vision changes, neck pain or stiffness, weakness, or other complaints. Pt with hx of alcohol abuse and states he has not drank in 5 years. Per chart review, head CT done in 2013 for HA was negative.  The history is provided by the patient.    Past Medical History:  Diagnosis Date  . Anxiety   . Bronchitis   . Chronic dental pain   . Dental caries   . Depression   . PTSD (post-traumatic stress disorder)     Patient Active Problem List   Diagnosis Date Noted  . Alcohol dependence (HCC) 05/28/2014  . Post traumatic stress disorder (PTSD) 05/28/2014    History reviewed. No pertinent surgical history.     Home Medications    Prior to Admission medications   Medication Sig Start Date End Date Taking? Authorizing Provider  busPIRone (BUSPAR) 7.5 MG tablet Take 7.5 mg by mouth 3 (three) times daily.   Yes [provider]  prazosin (MINIPRESS) 1 MG capsule Take 1 mg by mouth at bedtime.   Yes [provider]  QUEtiapine (SEROQUEL) 50 MG tablet Take 50 mg by mouth at bedtime.   Yes [provider]  sertraline (ZOLOFT) 50 MG tablet Take 50 mg by mouth daily.   Yes [provider]  amoxicillin (AMOXIL) 500 MG capsule Take 1 capsule (500 mg total) by mouth 3 (three) times daily. 02/27/17   Tilden Fossa, MD  clindamycin (CLEOCIN) 150 MG capsule Take 1 capsule (150 mg total) by mouth every 6 (six) hours. 05/11/16   Elson Areas, PA-C  diclofenac (VOLTAREN) 50 MG EC tablet Take 1 tablet (50 mg total) by mouth 2  (two) times daily. 05/11/16   Elson Areas, PA-C  HYDROcodone-acetaminophen (NORCO/VICODIN) 5-325 MG tablet Take 1 tablet by mouth every 6 (six) hours as needed for severe pain. 09/03/17   Gwyneth Sprout, MD  ibuprofen (ADVIL,MOTRIN) 800 MG tablet Take 1 tablet (800 mg total) by mouth 3 (three) times daily. 02/27/17   Tilden Fossa, MD  naproxen (NAPROSYN) 500 MG tablet Take 1 tablet (500 mg total) by mouth 2 (two) times daily with a meal. 10/09/16   Garlon Hatchet, PA-C  ondansetron (ZOFRAN ODT) 4 MG disintegrating tablet Take 1 tablet (4 mg total) by mouth every 8 (eight) hours as needed for nausea or vomiting. 07/13/16   Joy, Shawn C, PA-C  oxyCODONE-acetaminophen (PERCOCET/ROXICET) 5-325 MG tablet Take 1 tablet by mouth every 6 (six) hours as needed for severe pain. 07/13/16   Anselm Pancoast, PA-C    Family History History reviewed. No pertinent family history.  Social History Social History   Tobacco Use  . Smoking status: Current Every Day Smoker    Packs/day: 1.00    Years: 10.00    Pack years: 10.00    Types: Cigarettes  . Smokeless tobacco: Never Used  Substance Use Topics  . Alcohol use: No    Comment: former  . Drug use: No     Allergies   Patient has no  known allergies.   Review of Systems Review of Systems  Constitutional: Negative for fever.  Eyes: Positive for photophobia. Negative for visual disturbance.  Gastrointestinal: Positive for nausea and vomiting.  Musculoskeletal: Negative for neck pain and neck stiffness.  Neurological: Positive for headaches. Negative for syncope and weakness.  All other systems reviewed and are negative.    Physical Exam Updated Vital Signs BP 130/84 (BP Location: Right Arm)   Pulse 73   Temp 98.4 F (36.9 C) (Oral)   Resp 18   Ht 5\' 6"  (1.676 m)   Wt 87.1 kg (192 lb)   SpO2 99%   BMI 30.99 kg/m   Physical Exam  Constitutional: He appears well-developed and well-nourished. No distress.  HENT:  Head:  Normocephalic and atraumatic.  Eyes: Conjunctivae and EOM are normal. Pupils are equal, round, and reactive to light.  Neck: Normal range of motion. Neck supple.  No nuchal rigidity  Cardiovascular: Normal rate, regular rhythm, normal heart sounds and intact distal pulses.  Pulmonary/Chest: Effort normal and breath sounds normal.  Abdominal: Soft. Bowel sounds are normal. He exhibits no distension. There is no tenderness.  Neurological: He is alert.  Mental Status:  Alert, oriented, thought content appropriate, able to give a coherent history. Speech fluent without evidence of aphasia. Able to follow 2 step commands without difficulty.  Cranial Nerves:  II:  Peripheral visual fields grossly normal, pupils equal, round, reactive to light III,IV, VI: ptosis not present, extra-ocular motions intact bilaterally  V,VII: smile symmetric, facial light touch sensation equal VIII: hearing grossly normal to voice  X: uvula elevates symmetrically  XI: bilateral shoulder shrug symmetric and strong XII: midline tongue extension without fassiculations Motor:  Normal tone. 5/5 in upper and lower extremities bilaterally including strong and equal grip strength and dorsiflexion/plantar flexion Sensory: Pinprick and light touch normal in all extremities.  Deep Tendon Reflexes: 2+ and symmetric in the biceps and patella Cerebellar: normal finger-to-nose with bilateral upper extremities Gait: normal gait and balance CV: distal pulses palpable throughout    Skin: Skin is warm.  Psychiatric: He has a normal mood and affect. His behavior is normal.  Nursing note and vitals reviewed.    ED Treatments / Results  Labs (all labs ordered are listed, but only abnormal results are displayed) Labs Reviewed - No data to display  EKG  EKG Interpretation None       Radiology No results found.  Procedures Procedures (including critical care time)  Medications Ordered in ED Medications  sodium  chloride 0.9 % bolus 1,000 mL (1,000 mLs Intravenous New Bag/Given 11/22/17 2011)  ketorolac (TORADOL) 30 MG/ML injection 30 mg (30 mg Intravenous Given 11/22/17 2018)  metoCLOPramide (REGLAN) injection 10 mg (10 mg Intravenous Given 11/22/17 2011)  diphenhydrAMINE (BENADRYL) injection 25 mg (25 mg Intravenous Given 11/22/17 2014)  ondansetron (ZOFRAN) injection 4 mg (4 mg Intravenous Given 11/22/17 2126)     Initial Impression / Assessment and Plan / ED Course  I have reviewed the triage vital signs and the nursing notes.  Pertinent labs & imaging results that were available during my care of the patient were reviewed by me and considered in my medical decision making (see chart for details).     Pt HA treated and improved while in ED.  Presentation is like pts typical HA and non concerning for [facility], ICH, Meningitis, or temporal arteritis. Pt is afebrile with no focal neuro deficits, nuchal rigidity, or change in vision. Tolerating PO. Pt is to follow up  with PCP to discuss prophylactic medication. Pt verbalizes understanding and is agreeable with plan to dc.   Discussed results, findings, treatment and follow up. Patient advised of return precautions. Patient verbalized understanding and agreed with plan.  Final Clinical Impressions(s) / ED Diagnoses   Final diagnoses:  Bad headache    ED Discharge Orders    None       Jshawn Hurta, [place] N, PA-C 11/22/17 2216    Shaune PollackIsaacs, [place], MD 11/23/17 [phone]

## 2017-11-22 NOTE — ED Notes (Signed)
Pt. Speech is clear and fluent with no skips in words or cadence.  Pt. Follows all commands.

## 2017-11-24 ENCOUNTER — Other Ambulatory Visit: Payer: Self-pay

## 2017-11-24 ENCOUNTER — Emergency Department (HOSPITAL_BASED_OUTPATIENT_CLINIC_OR_DEPARTMENT_OTHER)
Admission: EM | Admit: 2017-11-24 | Discharge: 2017-11-24 | Disposition: A | Discharge status: Home / Self Care | Payer: Self-pay | Attending: Emergency Medicine | Admitting: Emergency Medicine

## 2017-11-24 ENCOUNTER — Encounter (HOSPITAL_BASED_OUTPATIENT_CLINIC_OR_DEPARTMENT_OTHER): Payer: Self-pay | Admitting: Emergency Medicine

## 2017-11-24 DIAGNOSIS — R51 Headache: Secondary | ICD-10-CM | POA: Insufficient documentation

## 2017-11-24 DIAGNOSIS — Z5321 Procedure and treatment not carried out due to patient leaving prior to being seen by health care provider: Secondary | ICD-10-CM | POA: Insufficient documentation

## 2017-11-24 NOTE — ED Triage Notes (Addendum)
Patient reports he continues to have a headache and vomiting and had a bloody nose this morning.  Reports that his ears feel like they need to pop as well.

## 2018-07-04 ENCOUNTER — Emergency Department (HOSPITAL_BASED_OUTPATIENT_CLINIC_OR_DEPARTMENT_OTHER)
Admission: EM | Admit: 2018-07-04 | Discharge: 2018-07-04 | Disposition: A | Discharge status: Home / Self Care | Payer: Self-pay | Attending: Emergency Medicine | Admitting: Emergency Medicine

## 2018-07-04 ENCOUNTER — Emergency Department (HOSPITAL_BASED_OUTPATIENT_CLINIC_OR_DEPARTMENT_OTHER): Payer: Self-pay

## 2018-07-04 ENCOUNTER — Other Ambulatory Visit: Payer: Self-pay

## 2018-07-04 ENCOUNTER — Encounter (HOSPITAL_BASED_OUTPATIENT_CLINIC_OR_DEPARTMENT_OTHER): Payer: Self-pay | Admitting: *Deleted

## 2018-07-04 DIAGNOSIS — F17210 Nicotine dependence, cigarettes, uncomplicated: Secondary | ICD-10-CM | POA: Insufficient documentation

## 2018-07-04 DIAGNOSIS — K209 Esophagitis, unspecified without bleeding: Secondary | ICD-10-CM

## 2018-07-04 DIAGNOSIS — K760 Fatty (change of) liver, not elsewhere classified: Secondary | ICD-10-CM | POA: Insufficient documentation

## 2018-07-04 DIAGNOSIS — K449 Diaphragmatic hernia without obstruction or gangrene: Secondary | ICD-10-CM | POA: Insufficient documentation

## 2018-07-04 DIAGNOSIS — Z79899 Other long term (current) drug therapy: Secondary | ICD-10-CM | POA: Insufficient documentation

## 2018-07-04 DIAGNOSIS — R197 Diarrhea, unspecified: Secondary | ICD-10-CM

## 2018-07-04 DIAGNOSIS — R112 Nausea with vomiting, unspecified: Secondary | ICD-10-CM

## 2018-07-04 DIAGNOSIS — R748 Abnormal levels of other serum enzymes: Secondary | ICD-10-CM

## 2018-07-04 HISTORY — DX: Alcohol abuse, uncomplicated: F10.10

## 2018-07-04 LAB — URINALYSIS, ROUTINE W REFLEX MICROSCOPIC
Glucose, UA: NEGATIVE mg/dL
Hgb urine dipstick: NEGATIVE
Ketones, ur: >80 mg/dL — AB
LEUKOCYTES UA: NEGATIVE
NITRITE: POSITIVE — AB
PH: 6.5 (ref 5.0–8.0)
Protein, ur: 100 mg/dL — AB
SPECIFIC GRAVITY, URINE: 1.02 (ref 1.005–1.030)

## 2018-07-04 LAB — COMPREHENSIVE METABOLIC PANEL
ALBUMIN: 4.4 g/dL (ref 3.5–5.0)
ALT: 306 U/L — ABNORMAL HIGH (ref 0–44)
AST: 273 U/L — ABNORMAL HIGH (ref 15–41)
Alkaline Phosphatase: 57 U/L (ref 38–126)
Anion gap: 15 (ref 5–15)
BILIRUBIN TOTAL: 1.6 mg/dL — AB (ref 0.3–1.2)
BUN: 8 mg/dL (ref 6–20)
CO2: 25 mmol/L (ref 22–32)
Calcium: 9.4 mg/dL (ref 8.9–10.3)
Chloride: 98 mmol/L (ref 98–111)
Creatinine, Ser: 1.14 mg/dL (ref 0.61–1.24)
GLUCOSE: 101 mg/dL — AB (ref 70–99)
POTASSIUM: 3.6 mmol/L (ref 3.5–5.1)
Sodium: 138 mmol/L (ref 135–145)
TOTAL PROTEIN: 7.7 g/dL (ref 6.5–8.1)

## 2018-07-04 LAB — URINALYSIS, MICROSCOPIC (REFLEX)

## 2018-07-04 LAB — CBC
HEMATOCRIT: 48.7 % (ref 39.0–52.0)
HEMOGLOBIN: 16.6 g/dL (ref 13.0–17.0)
MCH: 34.1 pg — ABNORMAL HIGH (ref 26.0–34.0)
MCHC: 37 g/dL — ABNORMAL HIGH (ref 30.0–36.0)
MCV: 90 fL (ref 78.0–100.0)
Platelets: 175 10*3/uL (ref 150–400)
RBC: 5.41 MIL/uL (ref 4.22–5.81)
RDW: 14.3 % (ref 11.5–15.5)
WBC: 7.2 10*3/uL (ref 4.0–10.5)

## 2018-07-04 LAB — RAPID URINE DRUG SCREEN, HOSP PERFORMED
Amphetamines: NOT DETECTED
BENZODIAZEPINES: NOT DETECTED
Cocaine: NOT DETECTED
Opiates: NOT DETECTED
Tetrahydrocannabinol: POSITIVE — AB

## 2018-07-04 MED ORDER — DICYCLOMINE HCL 10 MG/ML IM SOLN
20.0000 mg | Freq: Once | INTRAMUSCULAR | Status: AC
  Administered 2018-07-04: 20 mg via INTRAMUSCULAR
  Filled 2018-07-04: qty 2

## 2018-07-04 MED ORDER — SODIUM CHLORIDE 0.9 % IV BOLUS
1000.0000 mL | Freq: Once | INTRAVENOUS | Status: AC
  Administered 2018-07-04: 1000 mL via INTRAVENOUS

## 2018-07-04 MED ORDER — ONDANSETRON 8 MG PO TBDP: ORAL_TABLET | ORAL | 0 refills | Status: DC

## 2018-07-04 MED ORDER — ONDANSETRON HCL 4 MG/2ML IJ SOLN
4.0000 mg | Freq: Once | INTRAMUSCULAR | Status: AC | PRN
  Administered 2018-07-04: 4 mg via INTRAVENOUS
  Filled 2018-07-04: qty 2

## 2018-07-04 MED ORDER — OMEPRAZOLE 20 MG PO CPDR: 20.0000 mg | DELAYED_RELEASE_CAPSULE | Freq: Every day | ORAL | 0 refills | Status: DC

## 2018-07-04 MED ORDER — GI COCKTAIL ~~LOC~~
30.0000 mL | Freq: Once | ORAL | Status: AC
  Administered 2018-07-04: 30 mL via ORAL
  Filled 2018-07-04: qty 30

## 2018-07-04 NOTE — ED Triage Notes (Signed)
Pt c/o n/v/d x 16 hrs

## 2018-07-04 NOTE — ED Provider Notes (Signed)
[facility] EMERGENCY DEPARTMENT Provider Note   CSN: [id number] Arrival date & time: 07/04/18  0050     History   Chief Complaint Chief Complaint  Patient presents with  . Emesis    HPI Frank Salinas is a 27 y.o. male.  The history is provided by the patient.  Emesis   This is a new problem. The current episode started 6 to 12 hours ago. The problem occurs 2 to 4 times per day. The problem has not changed since onset.The emesis has an appearance of stomach contents. There has been no fever. Associated symptoms include diarrhea. Pertinent negatives include no abdominal pain, no chills, no fever and no sweats. Risk factors include ill contacts.    Past Medical History:  Diagnosis Date  . Alcohol abuse   . Anxiety   . Bronchitis   . Chronic dental pain   . Dental caries   . Depression   . PTSD (post-traumatic stress disorder)     Patient Active Problem List   Diagnosis Date Noted  . Alcohol dependence (HCC) 05/28/2014  . Post traumatic stress disorder (PTSD) 05/28/2014    History reviewed. No pertinent surgical history.      Home Medications    Prior to Admission medications   Medication Sig Start Date End Date Taking? Authorizing Provider  amoxicillin (AMOXIL) 500 MG capsule Take 1 capsule (500 mg total) by mouth 3 (three) times daily. 02/27/17   Tilden Fossa, MD  busPIRone (BUSPAR) 7.5 MG tablet Take 7.5 mg by mouth 3 (three) times daily.    [provider]  clindamycin (CLEOCIN) 150 MG capsule Take 1 capsule (150 mg total) by mouth every 6 (six) hours. 05/11/16   Elson Areas, PA-C  diclofenac (VOLTAREN) 50 MG EC tablet Take 1 tablet (50 mg total) by mouth 2 (two) times daily. 05/11/16   Elson Areas, PA-C  HYDROcodone-acetaminophen (NORCO/VICODIN) 5-325 MG tablet Take 1 tablet by mouth every 6 (six) hours as needed for severe pain. 09/03/17   Gwyneth Sprout, MD  ibuprofen (ADVIL,MOTRIN) 800 MG tablet Take 1 tablet (800 mg total) by  mouth 3 (three) times daily. 02/27/17   Tilden Fossa, MD  naproxen (NAPROSYN) 500 MG tablet Take 1 tablet (500 mg total) by mouth 2 (two) times daily with a meal. 10/09/16   Garlon Hatchet, PA-C  omeprazole (PRILOSEC) 20 MG capsule Take 1 capsule (20 mg total) by mouth daily. 07/04/18   Brandee Markin, MD  ondansetron (ZOFRAN ODT) 4 MG disintegrating tablet Take 1 tablet (4 mg total) by mouth every 8 (eight) hours as needed for nausea or vomiting. 07/13/16   Joy, Shawn C, PA-C  ondansetron (ZOFRAN ODT) 8 MG disintegrating tablet 8mg  ODT q8 hours prn nausea 07/04/18   Deondra Labrador, MD  oxyCODONE-acetaminophen (PERCOCET/ROXICET) 5-325 MG tablet Take 1 tablet by mouth every 6 (six) hours as needed for severe pain. 07/13/16   Joy, Shawn C, PA-C  prazosin (MINIPRESS) 1 MG capsule Take 1 mg by mouth at bedtime.    [provider]  QUEtiapine (SEROQUEL) 50 MG tablet Take 50 mg by mouth at bedtime.    [provider]  sertraline (ZOLOFT) 50 MG tablet Take 50 mg by mouth daily.    [provider]    Family History History reviewed. No pertinent family history.  Social History Social History   Tobacco Use  . Smoking status: Current Every Day Smoker    Packs/day: 1.00    Years: 10.00  Pack years: 10.00    Types: Cigarettes  . Smokeless tobacco: Never Used  Substance Use Topics  . Alcohol use: No    Comment: former  . Drug use: No     Allergies   Patient has no known allergies.   Review of Systems Review of Systems  Constitutional: Negative for appetite change, chills and fever.  Eyes: Negative for photophobia.  Respiratory: Negative for shortness of breath.   Cardiovascular: Negative for chest pain, palpitations and leg swelling.  Gastrointestinal: Positive for diarrhea and vomiting. Negative for abdominal pain.  Genitourinary: Negative for enuresis and flank pain.  All other systems reviewed and are negative.    Physical Exam Updated Vital Signs BP  (!) 136/93 (BP Location: Right Arm)   Pulse 77   Temp 98.2 F (36.8 C) (Oral)   Resp 16   Ht 5\' 6"  (1.676 m)   Wt 90.7 kg (200 lb)   SpO2 99%   BMI 32.28 kg/m   Physical Exam  Constitutional: He is oriented to person, place, and time. He appears well-developed and well-nourished. No distress.  HENT:  Head: Normocephalic and atraumatic.  Mouth/Throat: No oropharyngeal exudate.  Eyes: Pupils are equal, round, and reactive to light. Conjunctivae are normal. No scleral icterus.  Neck: Normal range of motion. Neck supple.  Cardiovascular: Normal rate, regular rhythm, normal heart sounds and intact distal pulses.  Pulmonary/Chest: Effort normal and breath sounds normal. No stridor. He has no wheezes. He has no rales.  Abdominal: Soft. Bowel sounds are normal. He exhibits no mass. There is no tenderness. There is no rebound and no guarding. No hernia.  Musculoskeletal: Normal range of motion.  Neurological: He is alert and oriented to person, place, and time. He displays normal reflexes.  Skin: Skin is warm and dry. Capillary refill takes less than 2 seconds.  Nursing note and vitals reviewed.    ED Treatments / Results  Labs (all labs ordered are listed, but only abnormal results are displayed) Results for orders placed or performed during the hospital encounter of 07/04/18  Rapid urine drug screen (hospital performed)  Result Value Ref Range   Opiates NONE DETECTED NONE DETECTED   Cocaine NONE DETECTED NONE DETECTED   Benzodiazepines NONE DETECTED NONE DETECTED   Amphetamines NONE DETECTED NONE DETECTED   Tetrahydrocannabinol POSITIVE (A) NONE DETECTED   Barbiturates (A) NONE DETECTED    Result not available. Reagent lot number recalled by manufacturer.  Comprehensive metabolic panel  Result Value Ref Range   Sodium 138 135 - 145 mmol/L   Potassium 3.6 3.5 - 5.1 mmol/L   Chloride 98 98 - 111 mmol/L   CO2 25 22 - 32 mmol/L   Glucose, Bld 101 (H) 70 - 99 mg/dL   BUN 8 6 - 20  mg/dL   Creatinine, Ser 1.61 0.61 - 1.24 mg/dL   Calcium 9.4 8.9 - 09.6 mg/dL   Total Protein 7.7 6.5 - 8.1 g/dL   Albumin 4.4 3.5 - 5.0 g/dL   AST 045 (H) 15 - 41 U/L   ALT 306 (H) 0 - 44 U/L   Alkaline Phosphatase 57 38 - 126 U/L   Total Bilirubin 1.6 (H) 0.3 - 1.2 mg/dL   GFR calc non Af Amer >60 >60 mL/min   GFR calc Af Amer >60 >60 mL/min   Anion gap 15 5 - 15  CBC  Result Value Ref Range   WBC 7.2 4.0 - 10.5 K/uL   RBC 5.41 4.22 - 5.81 MIL/uL  Hemoglobin 16.6 13.0 - 17.0 g/dL   HCT 16.148.7 09.639.0 - 04.552.0 %   MCV 90.0 78.0 - 100.0 fL   MCH 34.1 (H) 26.0 - 34.0 pg   MCHC 37.0 (H) 30.0 - 36.0 g/dL   RDW 40.914.3 81.111.5 - 91.415.5 %   Platelets 175 150 - 400 K/uL  Urinalysis, Routine w reflex microscopic  Result Value Ref Range   Color, Urine ORANGE (A) YELLOW   APPearance CLEAR CLEAR   Specific Gravity, Urine 1.020 1.005 - 1.030   pH 6.5 5.0 - 8.0   Glucose, UA NEGATIVE NEGATIVE mg/dL   Hgb urine dipstick NEGATIVE NEGATIVE   Bilirubin Urine MODERATE (A) NEGATIVE   Ketones, ur >80 (A) NEGATIVE mg/dL   Protein, ur 782100 (A) NEGATIVE mg/dL   Nitrite POSITIVE (A) NEGATIVE   Leukocytes, UA NEGATIVE NEGATIVE  Urinalysis, Microscopic (reflex)  Result Value Ref Range   RBC / HPF 0-5 0 - 5 RBC/hpf   WBC, UA 0-5 0 - 5 WBC/hpf   Bacteria, UA FEW (A) NONE SEEN   Squamous Epithelial / LPF 0-5 0 - 5   Mucus PRESENT    Hyaline Casts, UA PRESENT    Ct Renal Stone Study  Result Date: 07/04/2018 CLINICAL DATA:  Vomiting and periumbilical pain. Elevated liver enzymes. EXAM: CT ABDOMEN AND PELVIS WITHOUT CONTRAST TECHNIQUE: Multidetector CT imaging of the abdomen and pelvis was performed following the standard protocol without IV contrast. COMPARISON:  None. FINDINGS: Lower chest: The lung bases are clear. Hepatobiliary: Significantly diffusely decreased hepatic density without evidence of focal lesion. Partially distended gallbladder without calcified stone or pericholecystic inflammation. No biliary  dilatation. Pancreas: No ductal dilatation or inflammation. Spleen: Normal in size without focal abnormality. Adrenals/Urinary Tract: Normal adrenal glands. No hydronephrosis or perinephric edema. No urolithiasis. Both ureters are decompressed without stones along the course. Urinary bladder completely empty. No bladder stone. Stomach/Bowel: Small hiatal hernia with distal esophageal wall thickening. Stomach physiologically distended. No bowel wall thickening, inflammatory change or obstruction. Normal appendix. Vascular/Lymphatic: No enlarged abdominal or pelvic lymph nodes. Normal caliber abdominal vessels. Reproductive: Prostate is unremarkable. Other: No free air, free fluid, or intra-abdominal fluid collection. Small fat containing umbilical hernia. Musculoskeletal: There are no acute or suspicious osseous abnormalities. IMPRESSION: 1. Small hiatal hernia with distal esophageal wall thickening suggesting reflux or esophagitis. 2. Advanced hepatic steatosis. Electronically Signed   By: Rubye OaksMelanie  Ehinger M.D.   On: 07/04/2018 03:12     Radiology Ct Renal Stone Study  Result Date: 07/04/2018 CLINICAL DATA:  Vomiting and periumbilical pain. Elevated liver enzymes. EXAM: CT ABDOMEN AND PELVIS WITHOUT CONTRAST TECHNIQUE: Multidetector CT imaging of the abdomen and pelvis was performed following the standard protocol without IV contrast. COMPARISON:  None. FINDINGS: Lower chest: The lung bases are clear. Hepatobiliary: Significantly diffusely decreased hepatic density without evidence of focal lesion. Partially distended gallbladder without calcified stone or pericholecystic inflammation. No biliary dilatation. Pancreas: No ductal dilatation or inflammation. Spleen: Normal in size without focal abnormality. Adrenals/Urinary Tract: Normal adrenal glands. No hydronephrosis or perinephric edema. No urolithiasis. Both ureters are decompressed without stones along the course. Urinary bladder completely empty. No  bladder stone. Stomach/Bowel: Small hiatal hernia with distal esophageal wall thickening. Stomach physiologically distended. No bowel wall thickening, inflammatory change or obstruction. Normal appendix. Vascular/Lymphatic: No enlarged abdominal or pelvic lymph nodes. Normal caliber abdominal vessels. Reproductive: Prostate is unremarkable. Other: No free air, free fluid, or intra-abdominal fluid collection. Small fat containing umbilical hernia. Musculoskeletal: There are no acute or  suspicious osseous abnormalities. IMPRESSION: 1. Small hiatal hernia with distal esophageal wall thickening suggesting reflux or esophagitis. 2. Advanced hepatic steatosis. Electronically Signed   By: Rubye Oaks M.D.   On: 07/04/2018 03:12    Procedures Procedures (including critical care time)  Medications Ordered in ED Medications  ondansetron (ZOFRAN) injection 4 mg (4 mg Intravenous Given 07/04/18 0121)  sodium chloride 0.9 % bolus 1,000 mL (0 mLs Intravenous Stopped 07/04/18 0209)  dicyclomine (BENTYL) injection 20 mg (20 mg Intramuscular Given 07/04/18 0210)  gi cocktail (Maalox,Lidocaine,Donnatal) (30 mLs Oral Given 07/04/18 0209)      Final Clinical Impressions(s) / ED Diagnoses   Final diagnoses:  Nausea vomiting and diarrhea  Esophagitis  Hiatal hernia  Fatty liver  Elevated liver enzymes  Follow up with a family physician regarding your elevation of LFT.  No etoh.    Return for pain, numbness, changes in vision or speech, fevers >100.4 unrelieved by medication, shortness of breath, intractable vomiting, or diarrhea, abdominal pain, Inability to tolerate liquids or food, cough, altered mental status or any concerns. No signs of systemic illness or infection. The patient is nontoxic-appearing on exam and vital signs are within normal limits. Will refer to urology for microscopy hematuria as patient is asymptomatic.  I have reviewed the triage vital signs and the nursing notes. Pertinent  labs &imaging results that were available during my care of the patient were reviewed by me and considered in my medical decision making (see chart for details).  After history, exam, and medical workup I feel the patient has been appropriately medically screened and is safe for discharge home. Pertinent diagnoses were discussed with the patient. Patient was given return precautions.  ED Discharge Orders        Ordered    omeprazole (PRILOSEC) 20 MG capsule  Daily     07/04/18 0351    ondansetron (ZOFRAN ODT) 8 MG disintegrating tablet     07/04/18 0351       Nolton Denis, MD 07/04/18 0740

## 2018-09-11 ENCOUNTER — Encounter (HOSPITAL_BASED_OUTPATIENT_CLINIC_OR_DEPARTMENT_OTHER): Payer: Self-pay

## 2018-09-11 ENCOUNTER — Emergency Department (HOSPITAL_BASED_OUTPATIENT_CLINIC_OR_DEPARTMENT_OTHER)
Admission: EM | Admit: 2018-09-11 | Discharge: 2018-09-11 | Disposition: A | Discharge status: Home / Self Care | Payer: Self-pay | Attending: Emergency Medicine | Admitting: Emergency Medicine

## 2018-09-11 ENCOUNTER — Other Ambulatory Visit: Payer: Self-pay

## 2018-09-11 DIAGNOSIS — K0889 Other specified disorders of teeth and supporting structures: Secondary | ICD-10-CM

## 2018-09-11 DIAGNOSIS — F17210 Nicotine dependence, cigarettes, uncomplicated: Secondary | ICD-10-CM | POA: Insufficient documentation

## 2018-09-11 DIAGNOSIS — K047 Periapical abscess without sinus: Secondary | ICD-10-CM

## 2018-09-11 MED ORDER — NAPROXEN 375 MG PO TABS: 375.0000 mg | ORAL_TABLET | Freq: Two times a day (BID) | ORAL | 0 refills | Status: DC

## 2018-09-11 MED ORDER — PENICILLIN V POTASSIUM 500 MG PO TABS: 500.0000 mg | ORAL_TABLET | Freq: Four times a day (QID) | ORAL | 0 refills | Status: AC

## 2018-09-11 NOTE — ED Provider Notes (Signed)
[facility] EMERGENCY DEPARTMENT Provider Note   CSN: [id number] Arrival date & time: 09/11/18  1827     History   Chief Complaint Chief Complaint  Patient presents with  . Dental Pain    HPI Frank Salinas is a 27 y.o. male possible history of alcohol abuse, anxiety, bronchitis, depression, dental caries who presents for evaluation of left upper dental pain that is been ongoing for the last few days.  He reports that over last week, he has had some pain to the upper left portion of his left mouth.  Patient reports that today, he is noticed an area of the front upper gumline that he said was purulent drainage.  He states that the area was the size of a pea and was draining.  He states he has been taking ibuprofen with minimal improvement in his pain.  Patient states that he has been able to eat and drink without any difficulty.  He has been able to tolerate secretions without difficulty.  He denies any fever, facial swelling, difficulty breathing.  He does not currently follow with a dentist.  The history is provided by the patient.    Past Medical History:  Diagnosis Date  . Alcohol abuse   . Anxiety   . Bronchitis   . Chronic dental pain   . Dental caries   . Depression   . PTSD (post-traumatic stress disorder)     Patient Active Problem List   Diagnosis Date Noted  . Alcohol dependence (HCC) 05/28/2014  . Post traumatic stress disorder (PTSD) 05/28/2014    History reviewed. No pertinent surgical history.      Home Medications    Prior to Admission medications   Medication Sig Start Date End Date Taking? Authorizing Provider  amoxicillin (AMOXIL) 500 MG capsule Take 1 capsule (500 mg total) by mouth 3 (three) times daily. 02/27/17   Tilden Fossa, MD  busPIRone (BUSPAR) 7.5 MG tablet Take 7.5 mg by mouth 3 (three) times daily.    [provider]  clindamycin (CLEOCIN) 150 MG capsule Take 1 capsule (150 mg total) by mouth every 6 (six) hours.  05/11/16   Elson Areas, PA-C  diclofenac (VOLTAREN) 50 MG EC tablet Take 1 tablet (50 mg total) by mouth 2 (two) times daily. 05/11/16   Elson Areas, PA-C  HYDROcodone-acetaminophen (NORCO/VICODIN) 5-325 MG tablet Take 1 tablet by mouth every 6 (six) hours as needed for severe pain. 09/03/17   Gwyneth Sprout, MD  ibuprofen (ADVIL,MOTRIN) 800 MG tablet Take 1 tablet (800 mg total) by mouth 3 (three) times daily. 02/27/17   Tilden Fossa, MD  naproxen (NAPROSYN) 375 MG tablet Take 1 tablet (375 mg total) by mouth 2 (two) times daily. 09/11/18   Maxwell Caul, PA-C  omeprazole (PRILOSEC) 20 MG capsule Take 1 capsule (20 mg total) by mouth daily. 07/04/18   Palumbo, April, MD  ondansetron (ZOFRAN ODT) 4 MG disintegrating tablet Take 1 tablet (4 mg total) by mouth every 8 (eight) hours as needed for nausea or vomiting. 07/13/16   Joy, Shawn C, PA-C  ondansetron (ZOFRAN ODT) 8 MG disintegrating tablet 8mg  ODT q8 hours prn nausea 07/04/18   Palumbo, April, MD  oxyCODONE-acetaminophen (PERCOCET/ROXICET) 5-325 MG tablet Take 1 tablet by mouth every 6 (six) hours as needed for severe pain. 07/13/16   Joy, Shawn C, PA-C  penicillin v potassium (VEETID) 500 MG tablet Take 1 tablet (500 mg total) by mouth 4 (four) times daily for 7 days. 09/11/18  09/18/18  Maxwell CaulLayden, Murrel Freet A, PA-C  prazosin (MINIPRESS) 1 MG capsule Take 1 mg by mouth at bedtime.    [provider]  QUEtiapine (SEROQUEL) 50 MG tablet Take 50 mg by mouth at bedtime.    [provider]  sertraline (ZOLOFT) 50 MG tablet Take 50 mg by mouth daily.    [provider]    Family History No family history on file.  Social History Social History   Tobacco Use  . Smoking status: Current Every Day Smoker    Packs/day: 1.00    Years: 10.00    Pack years: 10.00    Types: Cigarettes  . Smokeless tobacco: Never Used  Substance Use Topics  . Alcohol use: No  . Drug use: No     Allergies   Patient has no known  allergies.   Review of Systems Review of Systems  Constitutional: Negative for fever.  HENT: Positive for dental problem. Negative for facial swelling.   Respiratory: Negative for shortness of breath.   Gastrointestinal: Negative for vomiting.  All other systems reviewed and are negative.    Physical Exam Updated Vital Signs BP (!) 135/104 (BP Location: Left Arm)   Pulse 64   Temp 98.2 F (36.8 C) (Oral)   Resp 18   Ht 5\' 6"  (1.676 m)   Wt 88.9 kg   SpO2 100%   BMI 31.64 kg/m   Physical Exam  Constitutional: He appears well-developed and well-nourished.  HENT:  Head: Normocephalic and atraumatic.  Mouth/Throat: Oropharynx is clear and moist and mucous membranes are normal. Abnormal dentition. Dental caries present.  Poor dentition.  Diffuse dental caries throughout all mouth.  Multiple broken teeth.  Tenderness palpation noted to the anterior frontal aspect overlying tooth number 9.  Tooth #9 is partially cracked.  No evidence of fluctuance.  No purulent drainage.  No evidence of gingival edema.  He has diffuse gingival erythema with calcium deposits noted throughout.  Uvula is midline.  No trismus.  No facial swelling.  Airways patent, phonation is intact.  No evidence of oral angioedema.  Eyes: Conjunctivae and EOM are normal. Right eye exhibits no discharge. Left eye exhibits no discharge. No scleral icterus.  Pulmonary/Chest: Effort normal.  Neurological: He is alert.  Skin: Skin is warm and dry.  Psychiatric: He has a normal mood and affect. His speech is normal and behavior is normal.  Nursing note and vitals reviewed.    ED Treatments / Results  Labs (all labs ordered are listed, but only abnormal results are displayed) Labs Reviewed - No data to display  EKG None  Radiology No results found.  Procedures Procedures (including critical care time)  Medications Ordered in ED Medications - No data to display   Initial Impression / Assessment and Plan / ED  Course  I have reviewed the triage vital signs and the nursing notes.  Pertinent labs & imaging results that were available during my care of the patient were reviewed by me and considered in my medical decision making (see chart for details).     27 y.o.   M presents with 1 week of dental pain.  Poor dentition at baseline.  Does not currently follow with a dentist.  Reports he had an area to the front overlying tooth 9 that was draining purulent drainage.  He reports that the area was a size of a PE.  No fevers, no difficulty tolerating p.o. Patient is afebrile , non-toxic appearing, sitting comfortably on examination table. Vital  signs reviewed and stable.  On exam, no evidence of purulent drainage.  The area that he was describing a P shaped area of drainage, there is no evidence of abscess or fluctuance that would require I&D here in the ED.  Plan to treat with antibiotics to cover for dental abscess.  Instructed that patient needs to follow-up with a dentist for further evaluation.  History/physical exam is not concerning for Ludwig angina or peritonsillar abscess. Patient instructed to follow-up with dentist referral provided. Stable for discharge at this time. Strict return precautions discussed. Patient expresses understanding and agreement to plan.  Final Clinical Impressions(s) / ED Diagnoses   Final diagnoses:  Pain, dental  Dental abscess    ED Discharge Orders         Ordered    naproxen (NAPROSYN) 375 MG tablet  2 times daily     09/11/18 2027    penicillin v potassium (VEETID) 500 MG tablet  4 times daily     09/11/18 2027           Maxwell Caul, PA-C 09/12/18 0001    Pricilla Loveless, MD 09/12/18 [phone]

## 2018-09-11 NOTE — Discharge Instructions (Addendum)
Take antibiotics as directed. Please take all of your antibiotics until finished.  Naprosyn as directed for pain.  The exam and treatment you received today has been provided on an emergency basis only. This is not a substitute for complete medical or dental care. If your problem worsens or new symptoms (problems) appear, and you are unable to arrange prompt follow-up care with your dentist, call or return to this location. If you do not have a dentist, please follow-up with one on the list provided  CALL YOUR DENTIST OR RETURN IMMEDIATELY IF you develop a fever, rash, difficulty breathing or swallowing, neck or facial swelling, or other potentially serious concerns.    Please follow-up with one of the dental clinics provided to you below or in your paperwork. Call and tell them you were seen in the Emergency Dept and arrange for an appointment. You may have to call multiple places in order to find a place to be seen.  Dental Assistance If the dentist on-call cannot see you, please use the resources below:   Patients with Medicaid: [facility] [phone], [phone] [address], [phone]  If unable to pay, or uninsured, contact [facility] [phone]) or [facility] [phone] in [place], [phone] in [place]) to become qualified for the adult dental clinic  Other Low-Cost Community Dental Services: [facility]- [address], [place], [place], [address]    [phone], Ext. [phone]    2nd and 4th Thursday of the month at 6:30am    10 clients each day by appointment, can sometimes see walk-in     patients if someone does not show for an appointment [facility]- [address], [place], [place], [address]    [phone] [facility]- [address], [place], [place], [address]    [phone]  [facility]- [phone] [facility]- [phone] [facility]- [phone]

## 2018-09-11 NOTE — ED Triage Notes (Signed)
C/o left upper pain to multiple teeth-hx of same-NAD-steady gait

## 2019-04-27 ENCOUNTER — Other Ambulatory Visit: Payer: Self-pay

## 2019-04-27 ENCOUNTER — Emergency Department (HOSPITAL_BASED_OUTPATIENT_CLINIC_OR_DEPARTMENT_OTHER)
Admission: EM | Admit: 2019-04-27 | Discharge: 2019-04-27 | Disposition: A | Discharge status: Home / Self Care | Payer: Self-pay | Attending: Emergency Medicine | Admitting: Emergency Medicine

## 2019-04-27 ENCOUNTER — Encounter (HOSPITAL_BASED_OUTPATIENT_CLINIC_OR_DEPARTMENT_OTHER): Payer: Self-pay | Admitting: Emergency Medicine

## 2019-04-27 ENCOUNTER — Emergency Department (HOSPITAL_BASED_OUTPATIENT_CLINIC_OR_DEPARTMENT_OTHER): Payer: Self-pay

## 2019-04-27 DIAGNOSIS — K649 Unspecified hemorrhoids: Secondary | ICD-10-CM

## 2019-04-27 DIAGNOSIS — R21 Rash and other nonspecific skin eruption: Secondary | ICD-10-CM | POA: Insufficient documentation

## 2019-04-27 DIAGNOSIS — E876 Hypokalemia: Secondary | ICD-10-CM | POA: Insufficient documentation

## 2019-04-27 DIAGNOSIS — Z79899 Other long term (current) drug therapy: Secondary | ICD-10-CM | POA: Insufficient documentation

## 2019-04-27 DIAGNOSIS — K859 Acute pancreatitis without necrosis or infection, unspecified: Secondary | ICD-10-CM | POA: Insufficient documentation

## 2019-04-27 DIAGNOSIS — F17210 Nicotine dependence, cigarettes, uncomplicated: Secondary | ICD-10-CM | POA: Insufficient documentation

## 2019-04-27 LAB — CBC WITH DIFFERENTIAL/PLATELET
Abs Immature Granulocytes: 0.04 10*3/uL (ref 0.00–0.07)
Basophils Absolute: 0 10*3/uL (ref 0.0–0.1)
Basophils Relative: 1 %
Eosinophils Absolute: 0.2 10*3/uL (ref 0.0–0.5)
Eosinophils Relative: 4 %
HCT: 40 % (ref 39.0–52.0)
Hemoglobin: 14.6 g/dL (ref 13.0–17.0)
Immature Granulocytes: 1 %
Lymphocytes Relative: 28 %
Lymphs Abs: 1.1 10*3/uL (ref 0.7–4.0)
MCH: 32.5 pg (ref 26.0–34.0)
MCHC: 36.5 g/dL — ABNORMAL HIGH (ref 30.0–36.0)
MCV: 89.1 fL (ref 80.0–100.0)
Monocytes Absolute: 0.4 10*3/uL (ref 0.1–1.0)
Monocytes Relative: 11 %
Neutro Abs: 2.1 10*3/uL (ref 1.7–7.7)
Neutrophils Relative %: 55 %
Platelets: 104 10*3/uL — ABNORMAL LOW (ref 150–400)
RBC: 4.49 MIL/uL (ref 4.22–5.81)
RDW: 12.6 % (ref 11.5–15.5)
Smear Review: NORMAL
WBC: 3.8 10*3/uL — ABNORMAL LOW (ref 4.0–10.5)
nRBC: 0 % (ref 0.0–0.2)

## 2019-04-27 LAB — URINALYSIS, ROUTINE W REFLEX MICROSCOPIC
Bilirubin Urine: NEGATIVE
Glucose, UA: NEGATIVE mg/dL
Hgb urine dipstick: NEGATIVE
Ketones, ur: NEGATIVE mg/dL
Leukocytes,Ua: NEGATIVE
Nitrite: NEGATIVE
Protein, ur: NEGATIVE mg/dL
Specific Gravity, Urine: <1.005 — ABNORMAL LOW (ref 1.005–1.030)
pH: 6.5 (ref 5.0–8.0)

## 2019-04-27 LAB — COMPREHENSIVE METABOLIC PANEL
ALT: 91 U/L — ABNORMAL HIGH (ref 0–44)
AST: 140 U/L — ABNORMAL HIGH (ref 15–41)
Albumin: 3.8 g/dL (ref 3.5–5.0)
Alkaline Phosphatase: 51 U/L (ref 38–126)
Anion gap: 14 (ref 5–15)
BUN: 6 mg/dL (ref 6–20)
CO2: 23 mmol/L (ref 22–32)
Calcium: 8.8 mg/dL — ABNORMAL LOW (ref 8.9–10.3)
Chloride: 95 mmol/L — ABNORMAL LOW (ref 98–111)
Creatinine, Ser: 0.86 mg/dL (ref 0.61–1.24)
GFR calc Af Amer: >60 mL/min (ref >60)
GFR calc non Af Amer: >60 mL/min (ref >60)
Glucose, Bld: 139 mg/dL — ABNORMAL HIGH (ref 70–99)
Potassium: 2.2 mmol/L — CL (ref 3.5–5.1)
Sodium: 132 mmol/L — ABNORMAL LOW (ref 135–145)
Total Bilirubin: 1.2 mg/dL (ref 0.3–1.2)
Total Protein: 6.9 g/dL (ref 6.5–8.1)

## 2019-04-27 LAB — LIPASE, BLOOD: Lipase: 79 U/L — ABNORMAL HIGH (ref 11–51)

## 2019-04-27 LAB — MAGNESIUM: Magnesium: 1.2 mg/dL — ABNORMAL LOW (ref 1.7–2.4)

## 2019-04-27 MED ORDER — HYDROMORPHONE HCL 1 MG/ML IJ SOLN
1.0000 mg | Freq: Once | INTRAMUSCULAR | Status: AC
  Administered 2019-04-27: 04:00:00 1 mg via INTRAVENOUS
  Filled 2019-04-27: qty 1

## 2019-04-27 MED ORDER — PROMETHAZINE HCL 25 MG PO TABS: 25.0000 mg | ORAL_TABLET | Freq: Four times a day (QID) | ORAL | 0 refills | Status: DC | PRN

## 2019-04-27 MED ORDER — LACTATED RINGERS IV BOLUS
1000.0000 mL | Freq: Once | INTRAVENOUS | Status: AC
  Administered 2019-04-27: 04:00:00 1000 mL via INTRAVENOUS

## 2019-04-27 MED ORDER — OXYCODONE HCL 5 MG PO TABS: 5.0000 mg | ORAL_TABLET | ORAL | 0 refills | Status: DC | PRN

## 2019-04-27 MED ORDER — MAGNESIUM SULFATE 2 GM/50ML IV SOLN
2.0000 g | Freq: Once | INTRAVENOUS | Status: AC
  Administered 2019-04-27: 05:00:00 2 g via INTRAVENOUS
  Filled 2019-04-27: qty 50

## 2019-04-27 MED ORDER — POTASSIUM CHLORIDE CRYS ER 20 MEQ PO TBCR
40.0000 meq | EXTENDED_RELEASE_TABLET | Freq: Once | ORAL | Status: AC
  Administered 2019-04-27: 05:00:00 40 meq via ORAL
  Filled 2019-04-27: qty 2

## 2019-04-27 MED ORDER — POTASSIUM CHLORIDE CRYS ER 20 MEQ PO TBCR: 40.0000 meq | EXTENDED_RELEASE_TABLET | Freq: Two times a day (BID) | ORAL | 0 refills | Status: DC

## 2019-04-27 MED ORDER — POTASSIUM CHLORIDE 10 MEQ/100ML IV SOLN
10.0000 meq | INTRAVENOUS | Status: AC
  Administered 2019-04-27 (×2): 10 meq via INTRAVENOUS
  Filled 2019-04-27 (×2): qty 100

## 2019-04-27 MED ORDER — OXYCODONE HCL 5 MG PO TABS
10.0000 mg | ORAL_TABLET | Freq: Once | ORAL | Status: AC
  Administered 2019-04-27: 08:00:00 10 mg via ORAL
  Filled 2019-04-27: qty 2

## 2019-04-27 MED ORDER — LACTATED RINGERS IV BOLUS
1000.0000 mL | Freq: Once | INTRAVENOUS | Status: AC
  Administered 2019-04-27: 1000 mL via INTRAVENOUS

## 2019-04-27 MED ORDER — ONDANSETRON HCL 4 MG/2ML IJ SOLN
4.0000 mg | Freq: Once | INTRAMUSCULAR | Status: AC
  Administered 2019-04-27: 04:00:00 4 mg via INTRAVENOUS
  Filled 2019-04-27: qty 2

## 2019-04-27 MED ORDER — OXYCODONE-ACETAMINOPHEN 5-325 MG PO TABS
2.0000 | ORAL_TABLET | Freq: Once | ORAL | Status: AC
  Administered 2019-04-27: 06:00:00 2 via ORAL
  Filled 2019-04-27: qty 2

## 2019-04-27 MED ORDER — IOHEXOL 300 MG/ML  SOLN
100.0000 mL | Freq: Once | INTRAMUSCULAR | Status: AC | PRN
  Administered 2019-04-27: 05:00:00 100 mL via INTRAVENOUS

## 2019-04-27 NOTE — ED Provider Notes (Signed)
Signout from Dr. Clayborne Dana.  28--year-old male here with vomiting and abdominal pain found to have pancreatitis by labs and CT.  Pain is controlled with narcotics and is getting IV fluids IV potassium repletion. Physical Exam  BP (!) 157/108   Pulse 93   Temp 98.4 F (36.9 C) (Oral)   Resp 18   Ht 5\' 6"  (1.676 m)   Wt 92.5 kg   SpO2 91%   BMI 32.93 kg/m   Physical Exam  ED Course/Procedures   Clinical Course as of Apr 26 1030  Sun Apr 27, 2019  3567 Patient's urinalysis came back and was unremarkable.  I went back to evaluate him and he said his pain is starting to recur.  No vomiting.   [MB]    Clinical Course User Index [MB] Terrilee Files, MD    Procedures  MDM  Plan is to follow-up on urinalysis and make sure he has adequate pain control.  Anticipated for discharge.       Terrilee Files, MD 04/27/19 1031

## 2019-04-27 NOTE — ED Notes (Signed)
  Patient instructed to give urine specimen and given urinal.  Patient asked if he can give stool sample but does not feel like he has to go.

## 2019-04-27 NOTE — Discharge Instructions (Addendum)
You appear to have pancreatitis on your CT scan and your labs that is probably because of the diarrhea and vomiting.  You need to take the nausea medicine and pain medicine as needed however your potassium is very low as well so if you are not tolerating stuff by mouth for longer than 6 or 8 hours you need to return to the emergency department for reevaluation.  If pain does not control with oral pain medication need to be reevaluated as well.  Not sure the cause your pancreatitis it could be from alcohol but there is other reasons that could cause it as well.  With the rash that you have he may have a condition called psoriasis that could be related.  Need to follow-up with her primary doctor to further explore that.  Have mildly elevated liver enzymes but this is better than it was previously.  This is probably related to drinking.

## 2019-04-27 NOTE — ED Provider Notes (Signed)
[facility] EMERGENCY DEPARTMENT Provider Note   CSN: [id number] Arrival date & time: 04/27/19  0316    History   Chief Complaint Chief Complaint  Patient presents with   Abdominal Pain   Flank Pain   Rectal Bleeding    HPI Frank Salinas is a 28 y.o. male.      Abdominal Pain  Pain location:  Generalized Pain quality: pressure, sharp and shooting   Pain radiates to:  Does not radiate Pain severity:  Mild Onset quality:  Gradual Duration:  1 day Timing:  Constant Progression:  Worsening Chronicity:  New Relieved by:  None tried Worsened by:  Nothing Associated symptoms: diarrhea, hematochezia and nausea   Associated symptoms: no dysuria, no shortness of breath and no vomiting   Flank Pain  Associated symptoms include abdominal pain. Pertinent negatives include no shortness of breath.  Rectal Bleeding  Quality:  Bright red Duration:  3 days Context: constipation and hemorrhoids   Relieved by:  None tried Associated symptoms: abdominal pain   Associated symptoms: no vomiting     Past Medical History:  Diagnosis Date   Alcohol abuse    Anxiety    Bronchitis    Chronic dental pain    Dental caries    Depression    PTSD (post-traumatic stress disorder)     Patient Active Problem List   Diagnosis Date Noted   Alcohol dependence (HCC) 05/28/2014   Post traumatic stress disorder (PTSD) 05/28/2014    History reviewed. No pertinent surgical history.      Home Medications    Prior to Admission medications   Medication Sig Start Date End Date Taking? Authorizing Provider  amoxicillin (AMOXIL) 500 MG capsule Take 1 capsule (500 mg total) by mouth 3 (three) times daily. 02/27/17   Tilden Fossa, MD  busPIRone (BUSPAR) 7.5 MG tablet Take 7.5 mg by mouth 3 (three) times daily.    [provider]  clindamycin (CLEOCIN) 150 MG capsule Take 1 capsule (150 mg total) by mouth every 6 (six) hours. 05/11/16   Elson Areas, PA-C   diclofenac (VOLTAREN) 50 MG EC tablet Take 1 tablet (50 mg total) by mouth 2 (two) times daily. 05/11/16   Elson Areas, PA-C  HYDROcodone-acetaminophen (NORCO/VICODIN) 5-325 MG tablet Take 1 tablet by mouth every 6 (six) hours as needed for severe pain. 09/03/17   Gwyneth Sprout, MD  ibuprofen (ADVIL,MOTRIN) 800 MG tablet Take 1 tablet (800 mg total) by mouth 3 (three) times daily. 02/27/17   Tilden Fossa, MD  naproxen (NAPROSYN) 375 MG tablet Take 1 tablet (375 mg total) by mouth 2 (two) times daily. 09/11/18   Maxwell Caul, PA-C  omeprazole (PRILOSEC) 20 MG capsule Take 1 capsule (20 mg total) by mouth daily. 07/04/18   Palumbo, April, MD  ondansetron (ZOFRAN ODT) 4 MG disintegrating tablet Take 1 tablet (4 mg total) by mouth every 8 (eight) hours as needed for nausea or vomiting. 07/13/16   Joy, Shawn C, PA-C  ondansetron (ZOFRAN ODT) 8 MG disintegrating tablet  ODT q8 hours prn nausea 07/04/18   Palumbo, April, MD  oxyCODONE-acetaminophen (PERCOCET/ROXICET) 5-325 MG tablet Take 1 tablet by mouth every 6 (six) hours as needed for severe pain. 07/13/16   Joy, Shawn C, PA-C  prazosin (MINIPRESS) 1 MG capsule Take 1 mg by mouth at bedtime.    [provider]  QUEtiapine (SEROQUEL) 50 MG tablet Take 50 mg by mouth at bedtime.    [provider]  sertraline (  ZOLOFT) 50 MG tablet Take 50 mg by mouth daily.    [provider]    Family History History reviewed. No pertinent family history.  Social History Social History   Tobacco Use   Smoking status: Current Every Day Smoker    Packs/day: 1.00    Years: 10.00    Pack years: 10.00    Types: Cigarettes   Smokeless tobacco: Never Used  Substance Use Topics   Alcohol use: No   Drug use: No     Allergies   Patient has no known allergies.   Review of Systems Review of Systems  Respiratory: Negative for shortness of breath.   Gastrointestinal: Positive for abdominal pain, diarrhea, hematochezia  and nausea. Negative for vomiting.  Genitourinary: Positive for flank pain. Negative for dysuria.  All other systems reviewed and are negative.    Physical Exam Updated Vital Signs BP (!) 159/114    Pulse 93    Temp 98.4 F (36.9 C) (Oral)    Resp (!) 21    Ht  (1.676 m)    Wt 92.5 kg    SpO2 94%    BMI 32.93 kg/m   Physical Exam Vitals signs and nursing note reviewed. Exam conducted with a chaperone present.  Constitutional:      Appearance: He is well-developed.  HENT:     Head: Normocephalic and atraumatic.     Nose: No congestion or rhinorrhea.  Eyes:     Extraocular Movements: Extraocular movements intact.     Conjunctiva/sclera: Conjunctivae normal.  Neck:     Musculoskeletal: Normal range of motion.  Cardiovascular:     Rate and Rhythm: Normal rate.  Pulmonary:     Effort: Pulmonary effort is normal. No respiratory distress.  Abdominal:     General: There is no distension.     Tenderness: There is abdominal tenderness. There is no guarding or rebound.  Genitourinary:    Rectum: External hemorrhoid (with an area of scab) present.  Musculoskeletal: Normal range of motion.  Skin:    General: Skin is warm and dry.     Findings: Rash (left and right flank and left axilla with areas of scaly rash) present.  Neurological:     General: No focal deficit present.     Mental Status: He is alert.      ED Treatments / Results  Labs (all labs ordered are listed, but only abnormal results are displayed) Labs Reviewed  CBC WITH DIFFERENTIAL/PLATELET - Abnormal; Notable for the following components:      Result Value   WBC 3.8 (*)    MCHC 36.5 (*)    Platelets 104 (*)    All other components within normal limits  COMPREHENSIVE METABOLIC PANEL - Abnormal; Notable for the following components:   Sodium 132 (*)    Potassium 2.2 (*)    Chloride 95 (*)    Glucose, Bld 139 (*)    Calcium 8.8 (*)    AST 140 (*)    ALT 91 (*)    All other components within normal  limits  LIPASE, BLOOD - Abnormal; Notable for the following components:   Lipase 79 (*)    All other components within normal limits  MAGNESIUM - Abnormal; Notable for the following components:   Magnesium 1.2 (*)    All other components within normal limits  GASTROINTESTINAL PANEL BY PCR, STOOL (REPLACES STOOL CULTURE)  URINALYSIS, ROUTINE W REFLEX MICROSCOPIC  HEPATITIS PANEL, ACUTE    EKG None  Radiology  Ct Abdomen Pelvis W Contrast  Result Date: 04/27/2019 CLINICAL DATA:  Initial evaluation for acute abdominal distension. EXAM: CT ABDOMEN AND PELVIS WITH CONTRAST TECHNIQUE: Multidetector CT imaging of the abdomen and pelvis was performed using the standard protocol following bolus administration of intravenous contrast. CONTRAST:  OMNIPAQUE IOHEXOL 300 MG/ML  SOLN COMPARISON:  Prior CT from 07/04/2018. FINDINGS: Lower chest: Subsegmental atelectatic changes seen dependently within the visualized lung bases. Visualized lungs are otherwise clear. Hepatobiliary: Severe hypoattenuation of the liver, consistent with steatosis. Liver otherwise unremarkable. Gallbladder within normal limits. No biliary dilatation. Pancreas: Hazy inflammatory stranding seen about the pancreatic head and uncinate process, suspicious for possible acute pancreatitis. No loculated peripancreatic collections. No evidence for pancreatic necrosis or other complication. No abnormal pancreatic ductal dilatation. Spleen: Spleen within normal limits. Adrenals/Urinary Tract: Adrenal glands within normal limits. Asymmetric enlargement of the right kidney with delayed nephrogram. There is a punctate 2 mm obstructing stone at the right UVJ with secondary mild right hydroureteronephrosis. Associated mild right perinephric and periureteral fat stranding. No other radiopaque calculi seen within the right kidney or along the course of the left kidney within normal limits without nephrolithiasis or hydronephrosis. No focal enhancing  renal mass. Bladder partially distended with no layering stones within the bladder lumen. Mild circumferential wall thickening could be related incomplete distension or possibly concomitant cystitis. Stomach/Bowel: Small hiatal hernia noted. Stomach decompressed without acute finding. Wall thickening with hazy inflammatory stranding about the duodenum favored to be related to adjacent pancreatitis. No evidence for bowel obstruction. Normal appendix. Colon diffusely decompressed. No other acute inflammatory changes seen about the bowels. Vascular/Lymphatic: Normal intravascular enhancement seen throughout the intra-abdominal aorta. Mesenteric vessels patent proximally. No adenopathy. Reproductive: Prostate and seminal vesicles within normal limits. Other: No free air or fluid. Musculoskeletal: No acute osseous finding. No discrete lytic or blastic osseous lesions. IMPRESSION: 1. Hazy inflammatory stranding about the pancreatic head and uncinate process, suspicious for acute pancreatitis. No complication identified. 2. 2 mm obstructive stone at the right UVJ with secondary mild right hydroureteronephrosis. 3. Mild circumferential wall thickening about the bladder, which may be related incomplete distension or possibly concomitant cystitis. Correlation with urinalysis recommended. 4. Severe hepatic steatosis. Electronically Signed   By: Rise Mu M.D.   On: 04/27/2019 05:17    Procedures Procedures (including critical care time)  Medications Ordered in ED Medications  potassium chloride 10 mEq in 100 mL IVPB (10 mEq Intravenous New Bag/Given 04/27/19 0558)  lactated ringers bolus 1,000 mL (0 mLs Intravenous Stopped 04/27/19 0500)  ondansetron (ZOFRAN) injection 4 mg (4 mg Intravenous Given 04/27/19 0347)  HYDROmorphone (DILAUDID) injection 1 mg (1 mg Intravenous Given 04/27/19 0348)  iohexol (OMNIPAQUE) 300 MG/ML solution 100 mL (100 mLs Intravenous Contrast Given 04/27/19 0446)  magnesium sulfate IVPB 2  g 50 mL (0 g Intravenous Stopped 04/27/19 0600)  potassium chloride SA (K-DUR) CR tablet 40 mEq (40 mEq Oral Given 04/27/19 0510)  oxyCODONE-acetaminophen (PERCOCET/ROXICET) 5-325 MG per tablet 2 tablet (2 tablets Oral Given 04/27/19 0556)     Initial Impression / Assessment and Plan / ED Course  I have reviewed the triage vital signs and the nursing notes.  Pertinent labs & imaging results that were available during my care of the patient were reviewed by me and considered in my medical decision making (see chart for details).   Patient with abdominal pain similar to constipation however with his diarrhea, could be obstruction? Has rectal bleeding but appears (and sounds like) probably from external  hemorrhoid. However, abdomen tender so will ct to eval for e/o colitis/IBD.   Rash appears c/w psoriasis, likely needs outpatient follow up for same. Psoriasis could be related to GI symptoms as well.   Hypokalemia and hypomagnesemia. Also w/ e/o of likely acute pancreatitis. Pain improved. Tolerating PO. Still getting K/Mg infusions will give oral pain meds and see how he does.   Pending urine, possibly cystitis on CT so will need that prior to dc to see if antibiotics needed.   Care transferred pending UA, anticipate discharge.   Final Clinical Impressions(s) / ED Diagnoses   Final diagnoses:  Hypokalemia  Hypomagnesemia  Hemorrhoids, unspecified hemorrhoid type  Acute pancreatitis, unspecified complication status, unspecified pancreatitis type  Rash    ED Discharge Orders    None       Jobany Montellano, Barbara CowerJason, MD 04/28/19 [phone]

## 2019-04-27 NOTE — ED Notes (Signed)
Pt unable to provide stool sample at this time

## 2019-04-27 NOTE — ED Notes (Signed)
Lab called and states K level is 2.2  Dr. Clayborne Dana aware.

## 2019-04-27 NOTE — ED Triage Notes (Signed)
  Patient comes in with lower abdominal pain and bilateral flank pain.  Patient states the pain started earlier tonight and he has had bloody stools the past 3 days.  Patient states pain is 9/10 and is sharp.  No fevers at home.  Patient states it feels like he is constipated x1000.  No difficulty urinating.  A&O x4.

## 2019-04-27 NOTE — ED Notes (Signed)
  Patient attempted to give stool and urine specimen but was unable to do so

## 2019-04-28 LAB — HEPATITIS PANEL, ACUTE
HCV Ab: <0.1 s/co ratio (ref 0.0–0.9)
Hep A IgM: NEGATIVE
Hep B C IgM: NEGATIVE
Hepatitis B Surface Ag: NEGATIVE

## 2019-10-28 ENCOUNTER — Other Ambulatory Visit: Payer: Self-pay

## 2019-10-28 DIAGNOSIS — Z20822 Contact with and (suspected) exposure to covid-19: Secondary | ICD-10-CM

## 2019-10-30 LAB — NOVEL CORONAVIRUS, NAA: SARS-CoV-2, NAA: NOT DETECTED

## 2020-08-23 ENCOUNTER — Encounter (HOSPITAL_BASED_OUTPATIENT_CLINIC_OR_DEPARTMENT_OTHER): Payer: Self-pay | Admitting: Emergency Medicine

## 2020-08-23 ENCOUNTER — Emergency Department (HOSPITAL_BASED_OUTPATIENT_CLINIC_OR_DEPARTMENT_OTHER)
Admission: EM | Admit: 2020-08-23 | Discharge: 2020-08-24 | Disposition: A | Discharge status: Home / Self Care | Payer: Self-pay | Attending: Emergency Medicine | Admitting: Emergency Medicine

## 2020-08-23 ENCOUNTER — Other Ambulatory Visit: Payer: Self-pay

## 2020-08-23 DIAGNOSIS — Z79899 Other long term (current) drug therapy: Secondary | ICD-10-CM | POA: Insufficient documentation

## 2020-08-23 DIAGNOSIS — Z20822 Contact with and (suspected) exposure to covid-19: Secondary | ICD-10-CM | POA: Insufficient documentation

## 2020-08-23 DIAGNOSIS — F17210 Nicotine dependence, cigarettes, uncomplicated: Secondary | ICD-10-CM | POA: Insufficient documentation

## 2020-08-23 DIAGNOSIS — R519 Headache, unspecified: Secondary | ICD-10-CM | POA: Insufficient documentation

## 2020-08-23 MED ORDER — DIVALPROEX SODIUM 500 MG PO DR TAB
500.0000 mg | DELAYED_RELEASE_TABLET | Freq: Once | ORAL | Status: DC
  Filled 2020-08-23: qty 1

## 2020-08-23 MED ORDER — METOCLOPRAMIDE HCL 10 MG PO TABS
10.0000 mg | ORAL_TABLET | Freq: Once | ORAL | Status: AC
  Administered 2020-08-24: 10 mg via ORAL
  Filled 2020-08-23: qty 1

## 2020-08-23 MED ORDER — NAPROXEN 250 MG PO TABS
500.0000 mg | ORAL_TABLET | ORAL | Status: AC
  Administered 2020-08-24: 500 mg via ORAL
  Filled 2020-08-23: qty 2

## 2020-08-23 MED ORDER — DIPHENHYDRAMINE HCL 12.5 MG/5ML PO ELIX
12.5000 mg | ORAL_SOLUTION | Freq: Once | ORAL | Status: AC
  Administered 2020-08-24: 12.5 mg via ORAL
  Filled 2020-08-23: qty 10

## 2020-08-23 MED ORDER — ACETAMINOPHEN 500 MG PO TABS
1000.0000 mg | ORAL_TABLET | Freq: Once | ORAL | Status: AC
  Administered 2020-08-24: 1000 mg via ORAL
  Filled 2020-08-23: qty 2

## 2020-08-23 NOTE — ED Triage Notes (Signed)
Headache x 3 weeks on and off yet today is worse. Negative covid 08/19/2020. nasal congestion. No further symptoms.

## 2020-08-24 ENCOUNTER — Emergency Department (HOSPITAL_BASED_OUTPATIENT_CLINIC_OR_DEPARTMENT_OTHER): Payer: Self-pay

## 2020-08-24 LAB — SARS CORONAVIRUS 2 BY RT PCR (HOSPITAL ORDER, PERFORMED IN ~~LOC~~ HOSPITAL LAB): SARS Coronavirus 2: NEGATIVE

## 2020-08-24 MED ORDER — DIVALPROEX SODIUM ER 250 MG PO TB24
ORAL_TABLET | ORAL | Status: AC
  Administered 2020-08-24: 500 mg
  Filled 2020-08-24: qty 2

## 2020-08-24 MED ORDER — METOCLOPRAMIDE HCL 10 MG PO TABS: 10.0000 mg | ORAL_TABLET | Freq: Four times a day (QID) | ORAL | 0 refills | Status: DC | PRN

## 2020-08-24 NOTE — ED Provider Notes (Signed)
[facility] EMERGENCY DEPARTMENT Provider Note   CSN: [id number] Arrival date & time: 08/23/20  1816     History Chief Complaint  Patient presents with  . Headache    Frank Salinas is a 29 y.o. male.  The history is provided by the patient.  Headache Pain location:  Frontal Quality:  Dull Radiates to:  Does not radiate Severity currently:  10/10 Severity at highest:  10/10 Onset quality:  Gradual Duration:  3 weeks Timing:  Constant Progression:  Waxing and waning Chronicity:  New Context: not activity and not intercourse   Relieved by:  Nothing Worsened by:  Nothing Ineffective treatments:  None tried Associated symptoms: no abdominal pain, no back pain, no blurred vision, no congestion, no cough, no diarrhea, no dizziness, no drainage, no ear pain, no eye pain, no facial pain, no fatigue, no fever, no focal weakness, no hearing loss, no loss of balance, no myalgias, no nausea, no near-syncope, no neck pain, no neck stiffness, no numbness, no paresthesias, no photophobia, no seizures, no sinus pressure, no sore throat, no swollen glands, no syncope, no tingling, no URI, no visual change, no vomiting and no weakness   Risk factors: no anger        Past Medical History:  Diagnosis Date  . Alcohol abuse   . Anxiety   . Bronchitis   . Chronic dental pain   . Dental caries   . Depression   . PTSD (post-traumatic stress disorder)     Patient Active Problem List   Diagnosis Date Noted  . Alcohol dependence (HCC) 05/28/2014  . Post traumatic stress disorder (PTSD) 05/28/2014    History reviewed. No pertinent surgical history.     No family history on file.  Social History   Tobacco Use  . Smoking status: Current Every Day Smoker    Packs/day: 1.00    Years: 10.00    Pack years: 10.00    Types: Cigarettes  . Smokeless tobacco: Never Used  Vaping Use  . Vaping Use: Never used  Substance Use Topics  . Alcohol use: No  . Drug use: No    Home  Medications Prior to Admission medications   Medication Sig Start Date End Date Taking? Authorizing Provider  amoxicillin (AMOXIL) 500 MG capsule Take 1 capsule (500 mg total) by mouth 3 (three) times daily. 02/27/17   Tilden Fossa, MD  busPIRone (BUSPAR) 7.5 MG tablet Take 7.5 mg by mouth 3 (three) times daily.    [provider]  clindamycin (CLEOCIN) 150 MG capsule Take 1 capsule (150 mg total) by mouth every 6 (six) hours. 05/11/16   Elson Areas, PA-C  diclofenac (VOLTAREN) 50 MG EC tablet Take 1 tablet (50 mg total) by mouth 2 (two) times daily. 05/11/16   Elson Areas, PA-C  HYDROcodone-acetaminophen (NORCO/VICODIN) 5-325 MG tablet Take 1 tablet by mouth every 6 (six) hours as needed for severe pain. 09/03/17   Gwyneth Sprout, MD  ibuprofen (ADVIL,MOTRIN) 800 MG tablet Take 1 tablet (800 mg total) by mouth 3 (three) times daily. 02/27/17   Tilden Fossa, MD  naproxen (NAPROSYN) 375 MG tablet Take 1 tablet (375 mg total) by mouth 2 (two) times daily. 09/11/18   Maxwell Caul, PA-C  omeprazole (PRILOSEC) 20 MG capsule Take 1 capsule (20 mg total) by mouth daily. 07/04/18   Chabeli Barsamian, MD  ondansetron (ZOFRAN ODT) 4 MG disintegrating tablet Take 1 tablet (4 mg total) by mouth every 8 (eight) hours as needed  for nausea or vomiting. 07/13/16   Joy, Shawn C, PA-C  ondansetron (ZOFRAN ODT) 8 MG disintegrating tablet 8mg  ODT q8 hours prn nausea 07/04/18   Luria Rosario, MD  oxyCODONE (ROXICODONE) 5 MG immediate release tablet Take 1-2 tablets (5-10 mg total) by mouth every 4 (four) hours as needed for severe pain. 04/27/19   Mesner, 06/27/19, MD  oxyCODONE-acetaminophen (PERCOCET/ROXICET) 5-325 MG tablet Take 1 tablet by mouth every 6 (six) hours as needed for severe pain. 07/13/16   Joy, Shawn C, PA-C  potassium chloride SA (K-DUR) 20 MEQ tablet Take 2 tablets (40 mEq total) by mouth 2 (two) times daily for 5 days. 04/27/19 05/02/19  Mesner, 07/02/19, MD  prazosin (MINIPRESS) 1 MG capsule  Take 1 mg by mouth at bedtime.    [provider]  promethazine (PHENERGAN) 25 MG tablet Take 1 tablet (25 mg total) by mouth every 6 (six) hours as needed for nausea or vomiting. 04/27/19   Mesner, 06/27/19, MD  QUEtiapine (SEROQUEL) 50 MG tablet Take 50 mg by mouth at bedtime.    [provider]  sertraline (ZOLOFT) 50 MG tablet Take 50 mg by mouth daily.    [provider]    Allergies    Patient has no known allergies.  Review of Systems   Review of Systems  Constitutional: Negative for fatigue and fever.  HENT: Negative for congestion, ear pain, hearing loss, postnasal drip, sinus pressure and sore throat.   Eyes: Negative for blurred vision, photophobia and pain.  Respiratory: Negative for cough.   Cardiovascular: Negative for syncope and near-syncope.  Gastrointestinal: Negative for abdominal pain, diarrhea, nausea and vomiting.  Genitourinary: Negative for difficulty urinating.  Musculoskeletal: Negative for back pain, myalgias, neck pain and neck stiffness.  Neurological: Positive for headaches. Negative for dizziness, focal weakness, seizures, facial asymmetry, weakness, numbness, paresthesias and loss of balance.  Psychiatric/Behavioral: Negative for agitation.  All other systems reviewed and are negative.   Physical Exam Updated Vital Signs BP 127/87 (BP Location: Right Arm)   Pulse 64   Temp 98.1 F (36.7 C) (Oral)   Resp 17   Ht 5\' 6"  (1.676 m)   Wt 97.5 kg   SpO2 99%   BMI 34.70 kg/m   Physical Exam Vitals and nursing note reviewed.  Constitutional:      General: He is not in acute distress.    Appearance: Normal appearance.  HENT:     Head: Normocephalic and atraumatic.     Nose: Nose normal.     Mouth/Throat:     Mouth: Mucous membranes are moist.     Pharynx: Oropharynx is clear.  Eyes:     Conjunctiva/sclera: Conjunctivae normal.     Pupils: Pupils are equal, round, and reactive to light.  Cardiovascular:     Rate and  Rhythm: Normal rate and regular rhythm.     Pulses: Normal pulses.     Heart sounds: Normal heart sounds.  Pulmonary:     Effort: Pulmonary effort is normal.     Breath sounds: Normal breath sounds.  Abdominal:     General: Abdomen is flat. Bowel sounds are normal.     Palpations: Abdomen is soft.     Tenderness: There is no abdominal tenderness. There is no guarding or rebound.  Musculoskeletal:        General: Normal range of motion.     Cervical back: Normal range of motion and neck supple.  Skin:    General: Skin is warm and dry.  Capillary Refill: Capillary refill takes less than 2 seconds.  Neurological:     General: No focal deficit present.     Mental Status: He is alert and oriented to person, place, and time.     Cranial Nerves: No cranial nerve deficit.     Deep Tendon Reflexes: Reflexes normal.  Psychiatric:        Mood and Affect: Mood normal.        Behavior: Behavior normal.     ED Results / Procedures / Treatments   Labs (all labs ordered are listed, but only abnormal results are displayed) Labs Reviewed  SARS CORONAVIRUS 2 BY RT PCR ([facility]OSPITAL ORDER[facility])    EKG None  Radiology CT Head Wo Contrast  Result Date: 08/24/2020 CLINICAL DATA:  Headache for 3 weeks on and off EXAM: CT HEAD WITHOUT CONTRAST TECHNIQUE: Contiguous axial images were obtained from the base of the skull through the vertex without intravenous contrast. COMPARISON:  December 05, 2012 FINDINGS: Brain: No evidence of acute territorial infarction, hemorrhage, hydrocephalus,extra-axial collection or mass lesion/mass effect. Normal gray-white differentiation. Ventricles are normal in size and contour. Vascular: No hyperdense vessel or unexpected calcification. Skull: The skull is intact. No fracture or focal lesion identified. Sinuses/Orbits: The visualized paranasal sinuses and mastoid air cells are clear. The orbits and globes intact. Other: None IMPRESSION:  No acute intracranial abnormality. Electronically Signed   By: Jonna ClarkBindu  Avutu M.D.   On: 08/24/2020 00:10    Procedures Procedures (including critical care time)  Medications Ordered in ED Medications  naproxen (NAPROSYN) tablet 500 mg (500 mg Oral Given 08/24/20 0024)  metoCLOPramide (REGLAN) tablet 10 mg (10 mg Oral Given 08/24/20 0021)  diphenhydrAMINE (BENADRYL) 12.5 MG/5ML elixir 12.5 mg (12.5 mg Oral Given 08/24/20 0022)  acetaminophen (TYLENOL) tablet 1,000 mg (1,000 mg Oral Given 08/24/20 0021)  divalproex (DEPAKOTE ER) 250 MG 24 hr tablet (500 mg  Given 08/24/20 0021)    ED Course  I have reviewed the triage vital signs and the nursing notes.  Pertinent labs & imaging results that were available during my care of the patient were reviewed by me and considered in my medical decision making (see chart for details).    Only wanted pills.  Resting comfortably post medications.  No signs of intracranial infection.  No proptosis.  No changes in vision or speech or cognition.  DOubt cavernous sinus thrombosis.    Alberteen SamJack A Ellen was evaluated in Emergency Department on 08/24/2020 for the symptoms described in the history of present illness. He was evaluated in the context of the global COVID-19 pandemic, which necessitated consideration that the patient might be at risk for infection with the SARS-CoV-2 virus that causes COVID-19. Institutional protocols and algorithms that pertain to the evaluation of patients at risk for COVID-19 are in a state of rapid change based on information released by regulatory bodies including the CDC and federal and state organizations. These policies and algorithms were followed during the patient's care in the ED.  Final Clinical Impression(s) / ED Diagnoses Return for intractable cough, coughing up blood,fevers >100.4 unrelieved by medication, shortness of breath, intractable vomiting, chest pain, shortness of breath, weakness,numbness, changes in speech, facial  asymmetry,abdominal pain, passing out,Inability to tolerate liquids or food, cough, altered mental status or any concerns. No signs of systemic illness or infection. The patient is nontoxic-appearing on exam and vital signs are within normal limits.   I have reviewed the triage vital signs and the nursing  notes. Pertinent labs &imaging results that were available during my care of the patient were reviewed by me and considered in my medical decision making (see chart for details).After history, exam, and medical workup I feel the patient has beenappropriately medically screened and is safe for discharge home. Pertinent diagnoses were discussed with the patient. Patient was given return precautions.   Leena Tiede, MD 08/24/20 0230

## 2020-08-24 NOTE — ED Notes (Signed)
Spoke with EDP Palumbo about unavailability of divalproex DR in this facility and informed on alternatives; EDP gave verbal order for divalproex ER

## 2020-12-15 ENCOUNTER — Other Ambulatory Visit: Payer: Self-pay

## 2020-12-15 DIAGNOSIS — Z20822 Contact with and (suspected) exposure to covid-19: Secondary | ICD-10-CM

## 2020-12-16 LAB — NOVEL CORONAVIRUS, NAA: SARS-CoV-2, NAA: DETECTED — AB

## 2020-12-16 LAB — SARS-COV-2, NAA 2 DAY TAT

## 2020-12-19 ENCOUNTER — Emergency Department (HOSPITAL_COMMUNITY): Payer: HRSA Program

## 2020-12-19 ENCOUNTER — Other Ambulatory Visit: Payer: Self-pay

## 2020-12-19 ENCOUNTER — Emergency Department (HOSPITAL_COMMUNITY)
Admission: EM | Admit: 2020-12-19 | Discharge: 2020-12-20 | Disposition: A | Discharge status: Home / Self Care | Payer: HRSA Program | Attending: Emergency Medicine | Admitting: Emergency Medicine

## 2020-12-19 DIAGNOSIS — U071 COVID-19: Secondary | ICD-10-CM

## 2020-12-19 DIAGNOSIS — R112 Nausea with vomiting, unspecified: Secondary | ICD-10-CM | POA: Diagnosis present

## 2020-12-19 DIAGNOSIS — Z79899 Other long term (current) drug therapy: Secondary | ICD-10-CM | POA: Insufficient documentation

## 2020-12-19 DIAGNOSIS — F17210 Nicotine dependence, cigarettes, uncomplicated: Secondary | ICD-10-CM | POA: Diagnosis not present

## 2020-12-19 DIAGNOSIS — L539 Erythematous condition, unspecified: Secondary | ICD-10-CM | POA: Insufficient documentation

## 2020-12-19 LAB — CBC
HCT: 54 % — ABNORMAL HIGH (ref 39.0–52.0)
Hemoglobin: 19.4 g/dL — ABNORMAL HIGH (ref 13.0–17.0)
MCH: 32.2 pg (ref 26.0–34.0)
MCHC: 35.9 g/dL (ref 30.0–36.0)
MCV: 89.6 fL (ref 80.0–100.0)
Platelets: 93 10*3/uL — ABNORMAL LOW (ref 150–400)
RBC: 6.03 MIL/uL — ABNORMAL HIGH (ref 4.22–5.81)
RDW: 12.7 % (ref 11.5–15.5)
WBC: 4.3 10*3/uL (ref 4.0–10.5)
nRBC: 0 % (ref 0.0–0.2)

## 2020-12-19 LAB — BASIC METABOLIC PANEL
Anion gap: 15 (ref 5–15)
BUN: 5 mg/dL — ABNORMAL LOW (ref 6–20)
CO2: 24 mmol/L (ref 22–32)
Calcium: 8.9 mg/dL (ref 8.9–10.3)
Chloride: 96 mmol/L — ABNORMAL LOW (ref 98–111)
Creatinine, Ser: 0.95 mg/dL (ref 0.61–1.24)
GFR, Estimated: >60 mL/min (ref >60)
Glucose, Bld: 93 mg/dL (ref 70–99)
Potassium: 3.6 mmol/L (ref 3.5–5.1)
Sodium: 135 mmol/L (ref 135–145)

## 2020-12-19 MED ORDER — ALBUTEROL SULFATE HFA 108 (90 BASE) MCG/ACT IN AERS
2.0000 | INHALATION_SPRAY | RESPIRATORY_TRACT | Status: DC | PRN
  Administered 2020-12-19: 20:00:00 2 via RESPIRATORY_TRACT
  Filled 2020-12-19: qty 6.7

## 2020-12-19 NOTE — ED Triage Notes (Signed)
Pt was dx with Covid a couple of days ago. Has worsening SOB, diarrhea, fatigue, weakness and N/V.

## 2020-12-20 MED ORDER — DEXAMETHASONE 4 MG PO TABS
10.0000 mg | ORAL_TABLET | Freq: Once | ORAL | Status: AC
  Administered 2020-12-20: 06:00:00 10 mg via ORAL
  Filled 2020-12-20: qty 3

## 2020-12-20 MED ORDER — ONDANSETRON 4 MG PO TBDP
4.0000 mg | ORAL_TABLET | Freq: Once | ORAL | Status: AC
  Administered 2020-12-20: 06:00:00 4 mg via ORAL
  Filled 2020-12-20: qty 1

## 2020-12-20 MED ORDER — ONDANSETRON 4 MG PO TBDP: 4.0000 mg | ORAL_TABLET | Freq: Three times a day (TID) | ORAL | 0 refills | Status: DC | PRN

## 2020-12-20 NOTE — ED Provider Notes (Signed)
[facility] EMERGENCY DEPARTMENT Provider Note   CSN: [id number] Arrival date & time: 12/19/20  1821     History Chief Complaint  Patient presents with  . Shortness of Breath    Frank Salinas is a 29 y.o. male.  Patient to ED with complaint of nausea, vomiting, mild SOB, recently diagnosed with COVID (12/22). He reports several members of his household are infected. He is not COVID vaccinated. No significant congestion. He reports significant sore throat since vomiting started. He has been unable to eat or drink a significant amount. No hematemesis. No fever.   The history is provided by the patient. No language interpreter was used.       Past Medical History:  Diagnosis Date  . Alcohol abuse   . Anxiety   . Bronchitis   . Chronic dental pain   . Dental caries   . Depression   . PTSD (post-traumatic stress disorder)     Patient Active Problem List   Diagnosis Date Noted  . Alcohol dependence (HCC) 05/28/2014  . Post traumatic stress disorder (PTSD) 05/28/2014    No past surgical history on file.     No family history on file.  Social History   Tobacco Use  . Smoking status: Current Every Day Smoker    Packs/day: 1.00    Years: 10.00    Pack years: 10.00    Types: Cigarettes  . Smokeless tobacco: Never Used  Vaping Use  . Vaping Use: Never used  Substance Use Topics  . Alcohol use: No  . Drug use: No    Home Medications Prior to Admission medications   Medication Sig Start Date End Date Taking? Authorizing Provider  busPIRone (BUSPAR) 7.5 MG tablet Take 7.5 mg by mouth 3 (three) times daily.   Yes [provider]  doxepin (SINEQUAN) 10 MG capsule Take 1 capsule by mouth at bedtime. 04/08/20  Yes [provider]  hydrOXYzine (ATARAX/VISTARIL) 50 MG tablet Take 50 mg by mouth 3 (three) times daily as needed for anxiety or itching.   Yes [provider]  omeprazole (PRILOSEC) 20 MG capsule Take 1 capsule (20  mg total) by mouth daily. 07/04/18  Yes Palumbo, April, MD  QUEtiapine (SEROQUEL) 50 MG tablet Take 50 mg by mouth at bedtime.   Yes [provider]  sertraline (ZOLOFT) 50 MG tablet Take 50 mg by mouth daily.   Yes [provider]  metoCLOPramide (REGLAN) 10 MG tablet Take 1 tablet (10 mg total) by mouth every 6 (six) hours as needed for nausea (nausea/headache). Patient not taking: No sig reported 08/24/20   Palumbo, April, MD    Allergies    Patient has no known allergies.  Review of Systems   Review of Systems  Constitutional: Positive for fatigue. Negative for chills and fever.  HENT: Positive for sore throat.   Respiratory: Positive for cough and shortness of breath.   Cardiovascular: Negative.  Negative for chest pain.  Gastrointestinal: Positive for nausea and vomiting. Negative for abdominal pain and diarrhea.  Genitourinary: Negative for decreased urine volume.  Musculoskeletal: Negative.  Negative for myalgias.  Skin: Negative.   Neurological: Negative.  Negative for weakness.    Physical Exam Updated Vital Signs BP (!) 140/96   Pulse 87   Temp 98.4 F (36.9 C) (Oral)   Resp 12   Ht 5\' 6"  (1.676 m)   Wt 98.4 kg   SpO2 98%   BMI 35.02 kg/m   Physical Exam Vitals  and nursing note reviewed.  Constitutional:      Appearance: He is well-developed and well-nourished.  HENT:     Head: Normocephalic.     Mouth/Throat:     Mouth: Mucous membranes are moist.     Comments: Oropharyngeal erythema without swelling or exudates.  Cardiovascular:     Rate and Rhythm: Normal rate and regular rhythm.  Pulmonary:     Effort: Pulmonary effort is normal.     Breath sounds: Normal breath sounds. No wheezing, rhonchi or rales.  Chest:     Chest wall: No tenderness.  Abdominal:     General: Bowel sounds are normal.     Palpations: Abdomen is soft.     Tenderness: There is no abdominal tenderness. There is no guarding or rebound.  Musculoskeletal:         General: Normal range of motion.     Cervical back: Normal range of motion and neck supple.  Skin:    General: Skin is warm and dry.     Findings: No rash.  Neurological:     Mental Status: He is alert and oriented to person, place, and time.  Psychiatric:        Mood and Affect: Mood and affect normal.     ED Results / Procedures / Treatments   Labs (all labs ordered are listed, but only abnormal results are displayed) Labs Reviewed  BASIC METABOLIC PANEL - Abnormal; Notable for the following components:      Result Value   Chloride 96 (*)    BUN 5 (*)    All other components within normal limits  CBC - Abnormal; Notable for the following components:   RBC 6.03 (*)    Hemoglobin 19.4 (*)    HCT 54.0 (*)    Platelets 93 (*)    All other components within normal limits    EKG EKG Interpretation  Date/Time:  Sunday December 19 2020 19:58:28 EST Ventricular Rate:  82 PR Interval:  136 QRS Duration: 76 QT Interval:  378 QTC Calculation: 441 R Axis:   7 Text Interpretation: Normal sinus rhythm Normal ECG No old tracing to compare Reconfirmed by Drema Pry [phone]) on 12/20/2020 5:06:35 AM   Radiology DG Chest Portable 1 View  Result Date: 12/19/2020 CLINICAL DATA:  Shortness of breath. COVID-19 positive a couple days ago. EXAM: PORTABLE CHEST 1 VIEW COMPARISON:  02/15/2012 FINDINGS: Lungs are adequately inflated without focal airspace consolidation or effusion. Cardiomediastinal silhouette and remainder of the exam is unchanged. IMPRESSION: No active disease. Electronically Signed   By: Elberta Fortis M.D.   On: 12/19/2020 20:24    Procedures Procedures (including critical care time)  Medications Ordered in ED Medications  albuterol (VENTOLIN HFA) 108 (90 Base) MCG/ACT inhaler 2 puff (2 puffs Inhalation Given 12/19/20 1944)  dexamethasone (DECADRON) tablet 10 mg (has no administration in time range)  ondansetron (ZOFRAN-ODT) disintegrating tablet 4 mg (has no  administration in time range)    ED Course  I have reviewed the triage vital signs and the nursing notes.  Pertinent labs & imaging results that were available during my care of the patient were reviewed by me and considered in my medical decision making (see chart for details).    MDM Rules/Calculators/A&P                          Patient to ED with ss/sxs as per HPI. +COVID 12/22.  Overall well appearing. No hypoxia, tachycardia  or dyspnea on exam. Will provide Decadron, zofran and PO challenge.   On recheck, the patient feels much better. He is eating and drinking without difficulty/nausea/vomiting. VSS. Normal O2 saturation.   Feel he is appropriate for discharge home. Will have the [facility] contact him with regard to whether he will qualify for infusion.   Final Clinical Impression(s) / ED Diagnoses Final diagnoses:  None   1. Known COVID infection 2. Nausea and vomiting  Rx / DC Orders ED Discharge Orders    None       Danne Harbor 12/20/20 3300    Nira Conn, MD 12/20/20 [phone]

## 2020-12-20 NOTE — ED Notes (Signed)
Pt states feels much better after meds and snack.  Pt has inhaler with him.

## 2020-12-20 NOTE — Discharge Instructions (Signed)
Return to the emergency department if you have worsening shortness of breath, uncontrolled vomiting or new concern.   Otherwise, follow up with your doctor for recheck as needed. You will need to remain out of work for 10 days after the date of your positive test.

## 2021-01-22 IMAGING — CT CT HEAD W/O CM
3 series · 14 of 45 positions shown, 16 images · non-contrast
Comparison: December 05, 2012

CLINICAL DATA: Headache for 3 weeks on and off

EXAM:
CT HEAD WITHOUT CONTRAST
TECHNIQUE: Contiguous axial images were obtained from the base of the skull
through the vertex without intravenous contrast.

[Series 2: head wo · axial · 0.45mm/px · z∈[+1108,+1223]mm · 8 of 28 slices shown, 10 images]
[im 3/28  brain]
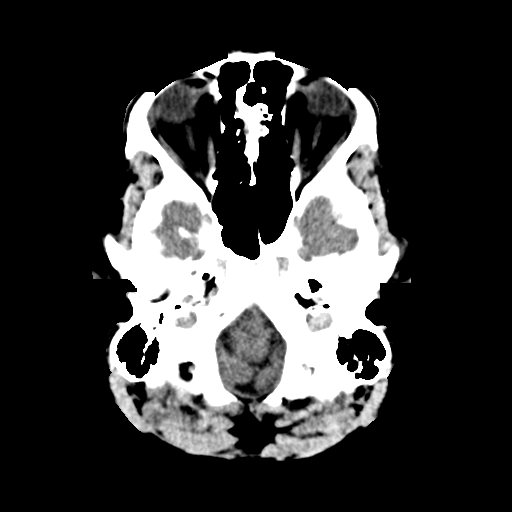
[im 3/28  bone]
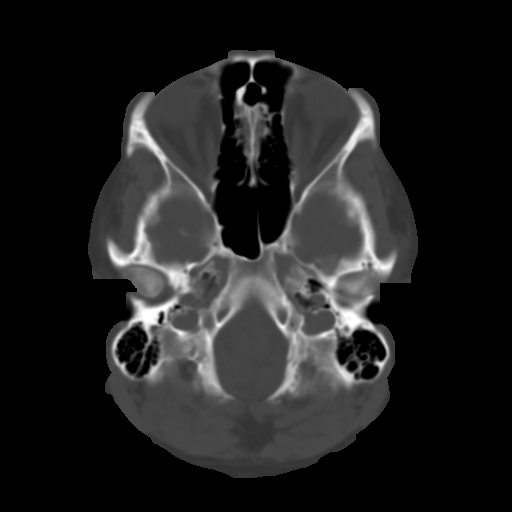
[im 6/28  brain]
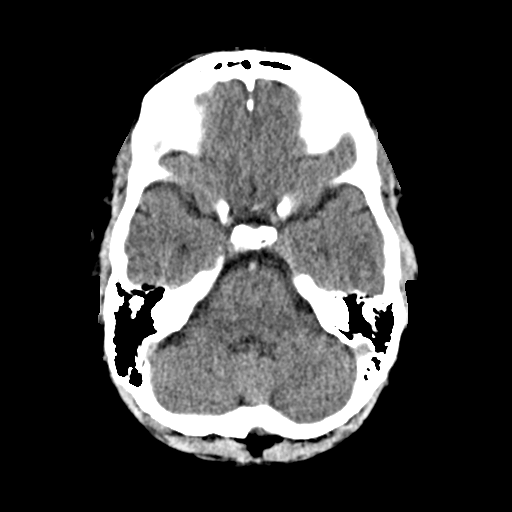
[im 10/28  brain]
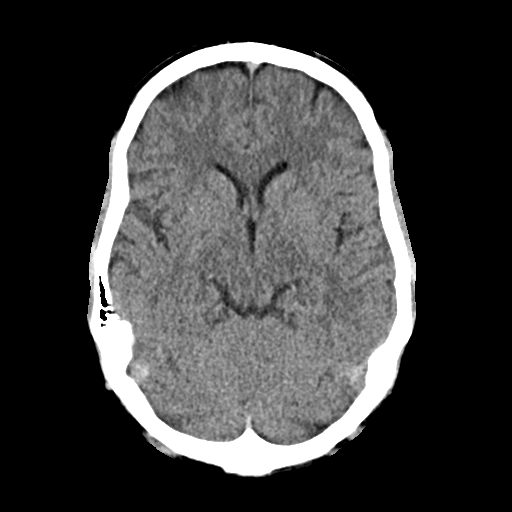
[im 13/28  brain]
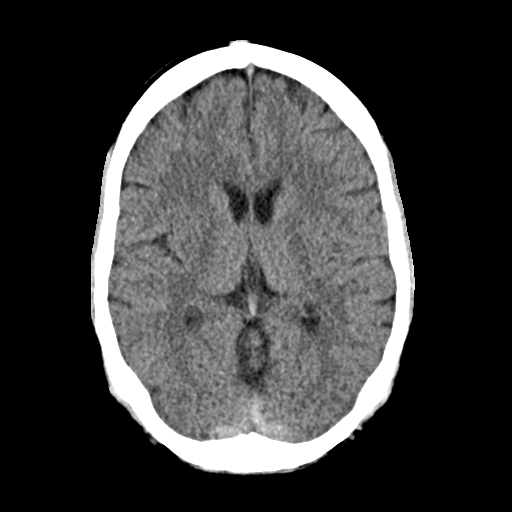
[im 16/28  brain]
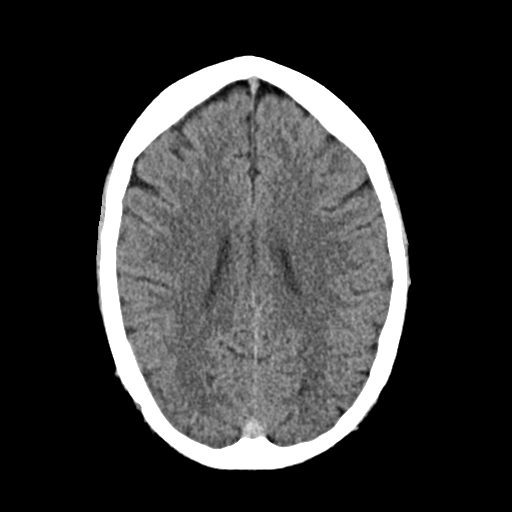
[im 16/28  bone]
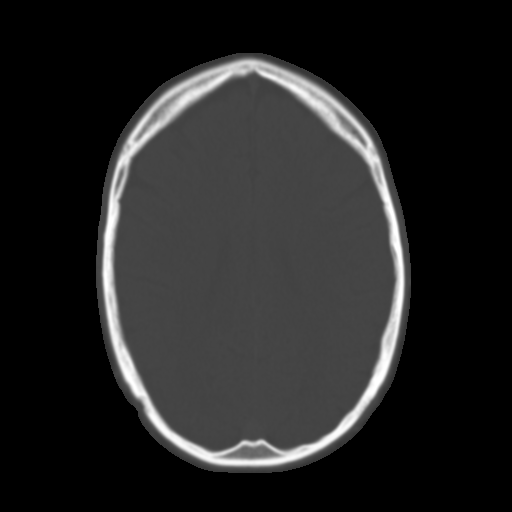
[im 19/28  brain]
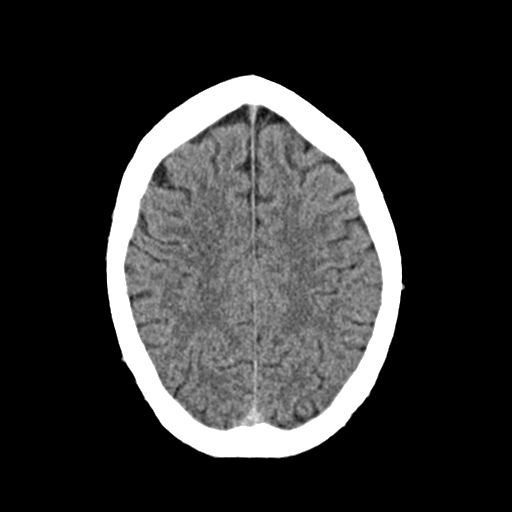
[im 23/28  brain]
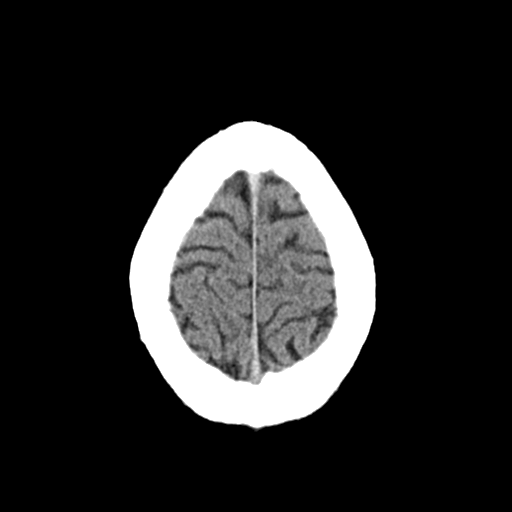
[im 26/28  brain]
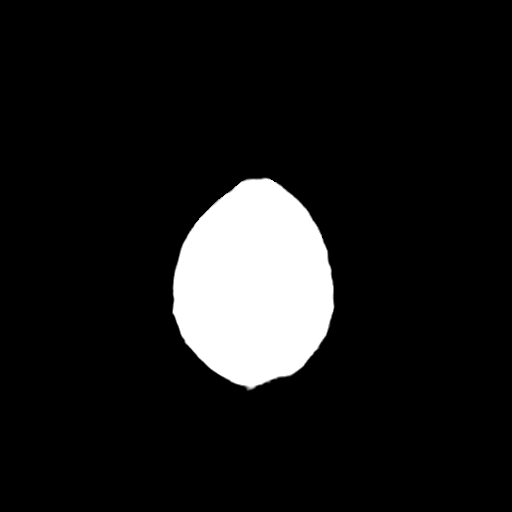

[Series 4: coronal soft · coronal · 0.31mm/px · 3 of 72 slices shown]
[im 24/72  brain]
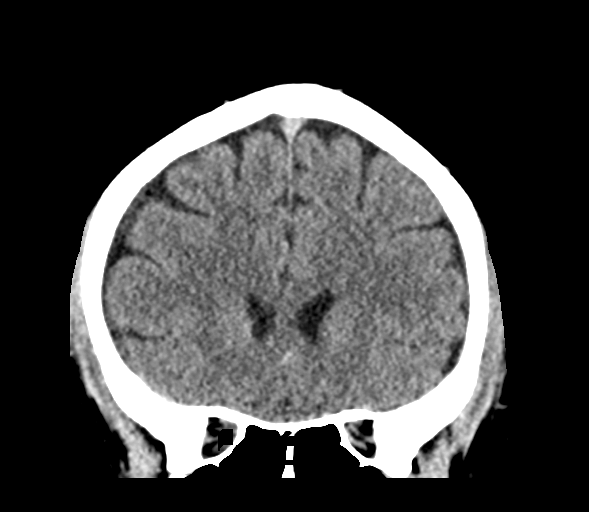
[im 32/72  brain]
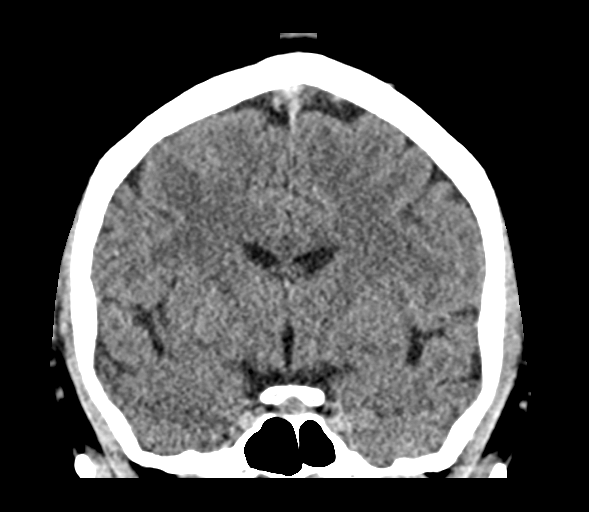
[im 40/72  brain]
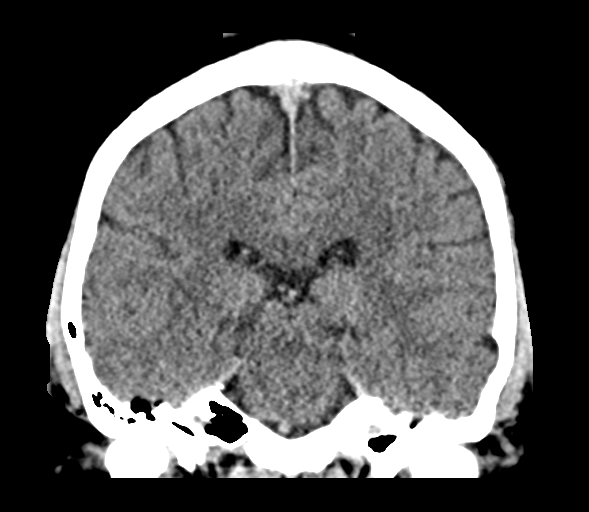

[Series 5: sag soft · sagittal · 0.27mm/px · 3 of 63 slices shown]
[im 21/63  brain]
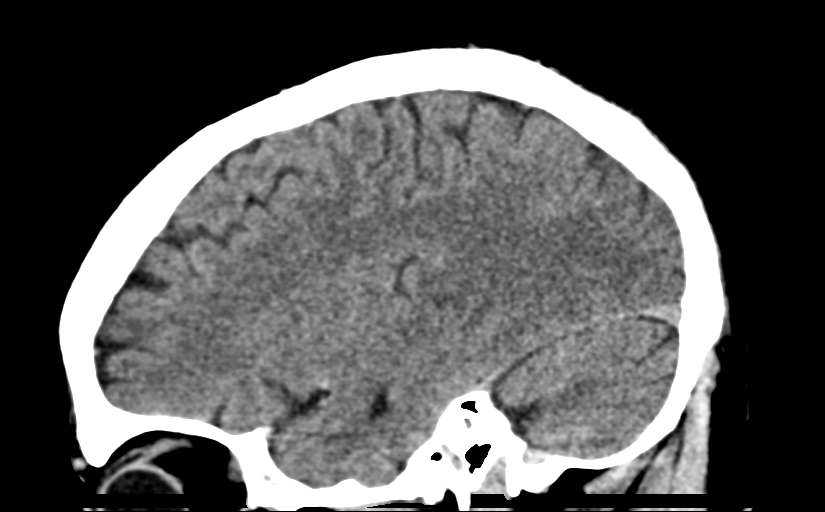
[im 32/63  brain]
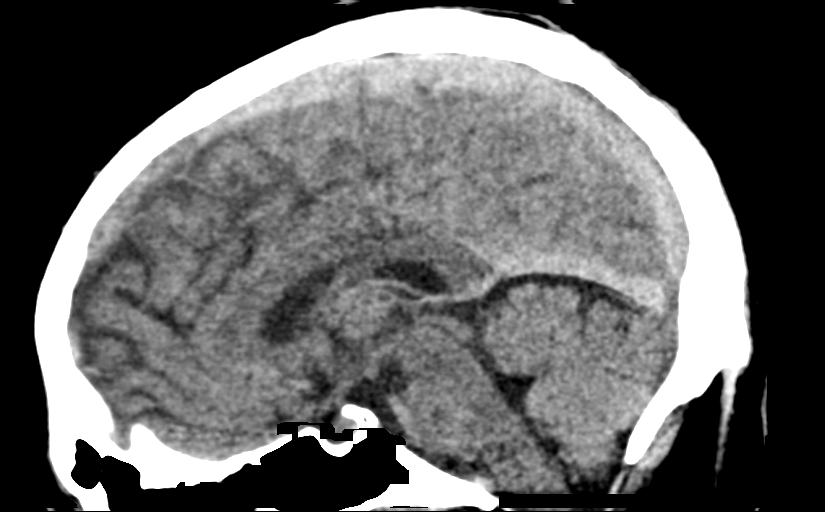
[im 42/63  brain]
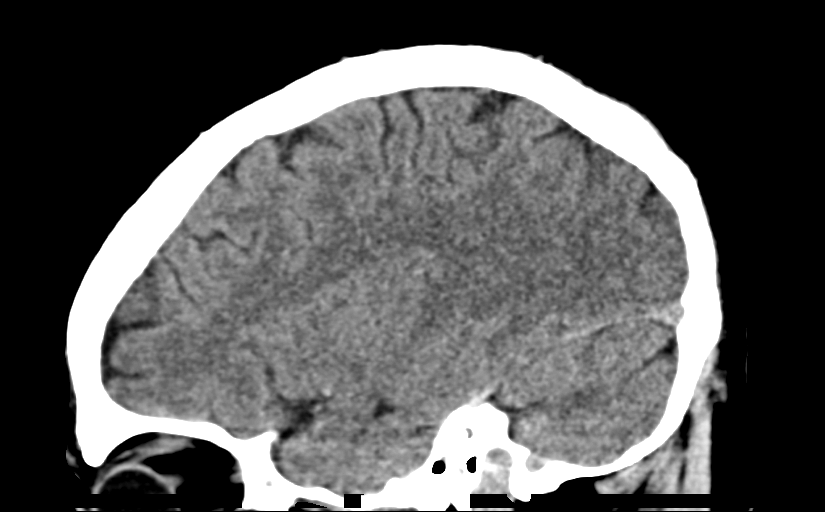

[14 of 45 positions shown; findings below may reference images not displayed]

FINDINGS: Brain: No evidence of acute territorial infarction, hemorrhage,
hydrocephalus,extra-axial collection or mass lesion/mass effect.
Normal gray-white differentiation. Ventricles are normal in size and
contour.

Vascular: No hyperdense vessel or unexpected calcification.

Skull: The skull is intact. No fracture or focal lesion identified.

Sinuses/Orbits: The visualized paranasal sinuses and mastoid air
cells are clear. The orbits and globes intact.

Other: None
IMPRESSION: No acute intracranial abnormality.

## 2022-11-23 ENCOUNTER — Emergency Department (HOSPITAL_BASED_OUTPATIENT_CLINIC_OR_DEPARTMENT_OTHER)
Admission: EM | Admit: 2022-11-23 | Discharge: 2022-11-23 | Disposition: A | Discharge status: Home / Self Care | Payer: Self-pay | Attending: Emergency Medicine | Admitting: Emergency Medicine

## 2022-11-23 ENCOUNTER — Other Ambulatory Visit: Payer: Self-pay

## 2022-11-23 ENCOUNTER — Encounter (HOSPITAL_BASED_OUTPATIENT_CLINIC_OR_DEPARTMENT_OTHER): Payer: Self-pay | Admitting: Emergency Medicine

## 2022-11-23 DIAGNOSIS — Z79899 Other long term (current) drug therapy: Secondary | ICD-10-CM | POA: Insufficient documentation

## 2022-11-23 DIAGNOSIS — K029 Dental caries, unspecified: Secondary | ICD-10-CM | POA: Insufficient documentation

## 2022-11-23 DIAGNOSIS — I10 Essential (primary) hypertension: Secondary | ICD-10-CM | POA: Insufficient documentation

## 2022-11-23 MED ORDER — AMOXICILLIN 500 MG PO CAPS
500.0000 mg | ORAL_CAPSULE | Freq: Once | ORAL | Status: AC
  Administered 2022-11-23: 500 mg via ORAL
  Filled 2022-11-23: qty 1

## 2022-11-23 MED ORDER — IBUPROFEN 800 MG PO TABS: 800.0000 mg | ORAL_TABLET | Freq: Three times a day (TID) | ORAL | 0 refills | Status: DC

## 2022-11-23 MED ORDER — AMOXICILLIN 500 MG PO CAPS: 500.0000 mg | ORAL_CAPSULE | Freq: Three times a day (TID) | ORAL | 0 refills | Status: DC

## 2022-11-23 MED ORDER — HYDROCODONE-ACETAMINOPHEN 5-325 MG PO TABS: 1.0000 | ORAL_TABLET | ORAL | 0 refills | Status: DC | PRN

## 2022-11-23 NOTE — ED Provider Notes (Signed)
[facility] EMERGENCY DEPT Provider Note   CSN: [id number] Arrival date & time: 11/23/22  7035     History  Chief Complaint  Patient presents with   Dental Pain    Frank Salinas is a 31 y.o. male.  Pt is a 31 yo with a pmhx significant for depression, ptsd, and htn.  Pt said he has had right sided dental pain for the past few days.  He has tried otc meds without improvement in sx.  Pt said he lost his insurance last year, and has not been able to see a pcp or a dentist.  He said [facility], so [facility] able [facility].        Home Medications Prior to Admission medications   Medication Sig Start Date End Date Taking? Authorizing Provider  amoxicillin (AMOXIL) 500 MG capsule Take 1 capsule (500 mg total) by mouth 3 (three) times daily. 11/23/22  Yes Jacalyn Lefevre, MD  HYDROcodone-acetaminophen (NORCO/VICODIN) 5-325 MG tablet Take 1 tablet by mouth every 4 (four) hours as needed. 11/23/22  Yes Jacalyn Lefevre, MD  ibuprofen (ADVIL) 800 MG tablet Take 1 tablet (800 mg total) by mouth 3 (three) times daily. 11/23/22  Yes Jacalyn Lefevre, MD  busPIRone (BUSPAR) 7.5 MG tablet Take 7.5 mg by mouth 3 (three) times daily.    [provider]  doxepin (SINEQUAN) 10 MG capsule Take 1 capsule by mouth at bedtime. 04/08/20   [provider]  hydrOXYzine (ATARAX/VISTARIL) 50 MG tablet Take 50 mg by mouth 3 (three) times daily as needed for anxiety or itching.    [provider]  metoCLOPramide (REGLAN) 10 MG tablet Take 1 tablet (10 mg total) by mouth every 6 (six) hours as needed for nausea (nausea/headache). Patient not taking: No sig reported 08/24/20   Palumbo, April, MD  omeprazole (PRILOSEC) 20 MG capsule Take 1 capsule (20 mg total) by mouth daily. 07/04/18   Palumbo, April, MD  ondansetron (ZOFRAN ODT) 4 MG disintegrating tablet Take 1 tablet (4 mg total) by mouth every 8 (eight) hours  as needed for nausea or vomiting. 12/20/20   Elpidio Anis, PA-C  QUEtiapine (SEROQUEL) 50 MG tablet Take 50 mg by mouth at bedtime.    [provider]  sertraline (ZOLOFT) 50 MG tablet Take 50 mg by mouth daily.    [provider]      Allergies    Patient has no known allergies.    Review of Systems   Review of Systems  HENT:  Positive for dental problem.   All other systems reviewed and are negative.   Physical Exam Updated Vital Signs BP (!) 147/100   Pulse 81   Temp 98.1 F (36.7 C) (Oral)   Resp 18   SpO2 99%  Physical Exam Vitals and nursing note reviewed.  Constitutional:      Appearance: Normal appearance.  HENT:     Head: Normocephalic and atraumatic.     Right Ear: External ear normal.     Left Ear: External ear normal.     Nose: Nose normal.     Mouth/Throat:     Comments: Multiple dental caries and multiple teeth missing Eyes:     Extraocular Movements: Extraocular movements intact.     Conjunctiva/sclera: Conjunctivae normal.     Pupils: Pupils are equal, round, and reactive to light.  Cardiovascular:     Rate and Rhythm: Normal rate  and regular rhythm.     Pulses: Normal pulses.     Heart sounds: Normal heart sounds.  Pulmonary:     Effort: Pulmonary effort is normal.     Breath sounds: Normal breath sounds.  Abdominal:     General: Abdomen is flat. Bowel sounds are normal.     Palpations: Abdomen is soft.  Musculoskeletal:        General: Normal range of motion.     Cervical back: Normal range of motion and neck supple.  Skin:    General: Skin is warm.     Capillary Refill: Capillary refill takes less than 2 seconds.  Neurological:     General: No focal deficit present.     Mental Status: He is alert and oriented to person, place, and time.  Psychiatric:        Mood and Affect: Mood normal.        Behavior: Behavior normal.     ED Results / Procedures / Treatments   Labs (all labs ordered are listed, but only abnormal  results are displayed) Labs Reviewed - No data to display  EKG None  Radiology No results found.  Procedures Procedures    Medications Ordered in ED Medications  amoxicillin (AMOXIL) capsule 500 mg (has no administration in time range)    ED Course/ Medical Decision Making/ A&P                           Medical Decision Making  This patient presents to the ED for concern of dental pain, this involves an extensive number of treatment options, and is a complaint that carries with it a high risk of complications and morbidity.  The differential diagnosis includes dental caries, abscess   Co morbidities that complicate the patient evaluation  Depression and htn   Additional history obtained:  Additional history obtained from epic chart review Medicines ordered and prescription drug management:  I ordered medication including amox  for infection  Reevaluation of the patient after these medicines showed that the patient stayed the same I have reviewed the patients home medicines and have made adjustments as needed    Problem List / ED Course:  Dental caries:  pt is d/c with amox, ibuprofen, and lortab.  He is to f/u with a dentist.  He is to return if worse.  HTN:  pt prefers to wait until he can get medical coverage with the [facility] to take any more meds.    Social Determinants of Health:  Lives at home, so insurance or pcp   Dispostion:  After consideration of the diagnostic results and the patients response to treatment, I feel that the patent would benefit from discharge with outpatient f/u.          Final Clinical Impression(s) / ED Diagnoses Final diagnoses:  Dental caries    Rx / DC Orders ED Discharge Orders          Ordered    amoxicillin (AMOXIL) 500 MG capsule  3 times daily        11/23/22 0909    ibuprofen (ADVIL) 800 MG tablet  3 times daily        11/23/22 0909    HYDROcodone-acetaminophen (NORCO/VICODIN) 5-325 MG tablet  Every 4 hours  PRN        11/23/22 0909              Jacalyn Lefevre, MD 11/23/22 [phone]

## 2022-11-23 NOTE — ED Triage Notes (Signed)
Pt here from home with c/o dental pain to the right side of his mouth , tried otc meds with minimal relief

## 2023-03-29 ENCOUNTER — Other Ambulatory Visit: Payer: Self-pay

## 2023-03-29 ENCOUNTER — Emergency Department (HOSPITAL_BASED_OUTPATIENT_CLINIC_OR_DEPARTMENT_OTHER)
Admission: EM | Admit: 2023-03-29 | Discharge: 2023-03-29 | Disposition: A | Discharge status: Home / Self Care | Payer: Self-pay | Attending: Emergency Medicine | Admitting: Emergency Medicine

## 2023-03-29 ENCOUNTER — Encounter (HOSPITAL_BASED_OUTPATIENT_CLINIC_OR_DEPARTMENT_OTHER): Payer: Self-pay

## 2023-03-29 DIAGNOSIS — K047 Periapical abscess without sinus: Secondary | ICD-10-CM | POA: Insufficient documentation

## 2023-03-29 MED ORDER — IBUPROFEN 800 MG PO TABS: 800.0000 mg | ORAL_TABLET | Freq: Three times a day (TID) | ORAL | 0 refills | Status: DC

## 2023-03-29 MED ORDER — PENICILLIN V POTASSIUM 500 MG PO TABS: 500.0000 mg | ORAL_TABLET | Freq: Three times a day (TID) | ORAL | 0 refills | Status: AC

## 2023-03-29 MED ORDER — HYDROCODONE-ACETAMINOPHEN 5-325 MG PO TABS
1.0000 | ORAL_TABLET | Freq: Once | ORAL | Status: AC
  Administered 2023-03-29: 1 via ORAL
  Filled 2023-03-29: qty 1

## 2023-03-29 NOTE — ED Provider Notes (Signed)
[facility] Provider Note   CSN: [id number] Arrival date & time: 03/29/23  [id number]     History  Chief Complaint  Patient presents with   Dental Pain    Frank Salinas is a 32 y.o. male.  32 year old male with no past medical history presents to the ED with a chief complaint of right lower gum pain for the past 3 days.  Does report this is a recurrent history for him.  He has tried some over-the-counter medication without any improvement in symptoms.  Reports his diet has been for the most part soft foods over the last couple days.  No systemic signs, no fevers.  Last time he saw dentist was several years ago, he does report he receives his insurance in approximately 28 days.  You were given a prescription for antibiotic  The history is provided by the patient.  Dental Pain Location:  Lower Associated symptoms: no fever        Home Medications Prior to Admission medications   Medication Sig Start Date End Date Taking? Authorizing Provider  ibuprofen (ADVIL) 800 MG tablet Take 1 tablet (800 mg total) by mouth 3 (three) times daily for 7 days. 03/29/23 04/05/23 Yes Lyndzie Zentz, Beverley Fiedler, PA-C  penicillin v potassium (VEETID) 500 MG tablet Take 1 tablet (500 mg total) by mouth 3 (three) times daily for 7 days. 03/29/23 04/05/23 Yes Jamair Cato, PA-C  busPIRone (BUSPAR) 7.5 MG tablet Take 7.5 mg by mouth 3 (three) times daily.    [provider]  doxepin (SINEQUAN) 10 MG capsule Take 1 capsule by mouth at bedtime. 04/08/20   [provider]  HYDROcodone-acetaminophen (NORCO/VICODIN) 5-325 MG tablet Take 1 tablet by mouth every 4 (four) hours as needed. 11/23/22   Isla Pence, MD  hydrOXYzine (ATARAX/VISTARIL) 50 MG tablet Take 50 mg by mouth 3 (three) times daily as needed for anxiety or itching.    [provider]  metoCLOPramide (REGLAN) 10 MG tablet Take 1 tablet (10 mg total) by mouth every 6 (six) hours as needed for nausea  (nausea/headache). Patient not taking: No sig reported 08/24/20   Palumbo, April, MD  omeprazole (PRILOSEC) 20 MG capsule Take 1 capsule (20 mg total) by mouth daily. 07/04/18   Palumbo, April, MD  ondansetron (ZOFRAN ODT) 4 MG disintegrating tablet Take 1 tablet (4 mg total) by mouth every 8 (eight) hours as needed for nausea or vomiting. 12/20/20   Charlann Lange, PA-C  QUEtiapine (SEROQUEL) 50 MG tablet Take 50 mg by mouth at bedtime.    [provider]  sertraline (ZOLOFT) 50 MG tablet Take 50 mg by mouth daily.    [provider]      Allergies    Patient has no known allergies.    Review of Systems   Review of Systems  Constitutional:  Negative for fever.  HENT:  Positive for dental problem.     Physical Exam Updated Vital Signs BP (!) 170/112 (BP Location: Right Arm)   Pulse 73   Temp 98.7 F (37.1 C) (Oral)   Resp 18   Ht 5\' 6"  (1.676 m)   Wt 103.4 kg   SpO2 94%   BMI 36.80 kg/m  Physical Exam Vitals and nursing note reviewed.  Constitutional:      Appearance: Normal appearance.  HENT:     Head: Normocephalic and atraumatic.     Mouth/Throat:     Mouth: Mucous membranes are moist.     Dentition: Abnormal  dentition. Dental tenderness and dental caries present. No dental abscesses.     Pharynx: Oropharynx is clear. Uvula midline.   Eyes:     Pupils: Pupils are equal, round, and reactive to light.  Cardiovascular:     Rate and Rhythm: Normal rate.  Pulmonary:     Effort: Pulmonary effort is normal.  Abdominal:     General: Abdomen is flat.  Musculoskeletal:     Cervical back: Normal range of motion and neck supple.  Skin:    General: Skin is warm and dry.  Neurological:     Mental Status: He is alert and oriented to person, place, and time.     ED Results / Procedures / Treatments   Labs (all labs ordered are listed, but only abnormal results are displayed) Labs Reviewed - No data to display  EKG None  Radiology No results  found.  Procedures Procedures    Medications Ordered in ED Medications  HYDROcodone-acetaminophen (NORCO/VICODIN) 5-325 MG per tablet 1 tablet (has no administration in time range)    ED Course/ Medical Decision Making/ A&P                             Medical Decision Making Risk Prescription drug management.   Patient here with complaints of dental pain for the past 3 days, does have poor dentition throughout, has not seen a dentist in several years.  Reports pain along the lower part of the gumline, no visible abscess noted.  He is afebrile here on arrival, does report he has financial strain, and does not have insurance for the next 20 days as he just started a new job.  Patient is agreeable to receiving prescription for antibiotics at this time as he has done this in the past.  He is otherwise without any systemic signs.  Patient is hemodynamically stable for discharge.     Portions of this note were generated with Scientist, clinical (histocompatibility and immunogenetics). Dictation errors may occur despite best attempts at proofreading.   Final Clinical Impression(s) / ED Diagnoses Final diagnoses:  Dental infection    Rx / DC Orders ED Discharge Orders          Ordered    penicillin v potassium (VEETID) 500 MG tablet  3 times daily        03/29/23 1700    ibuprofen (ADVIL) 800 MG tablet  3 times daily        03/29/23 1700              Claude Manges, PA-C 03/29/23 1709    Linwood Dibbles, MD 03/30/23 1538

## 2023-03-29 NOTE — ED Triage Notes (Signed)
Patient here POV from Home.  Endorses History of Dental Pain/Problems and states it has been bothersome and painful for 3 Days now.  NAD Noted during Triage. A&OX4. GCS 15. Ambulatory.

## 2023-03-29 NOTE — ED Notes (Signed)
Pt verbalized understanding of d/c instructions, meds, and followup care. Denies questions. VSS, no distress noted. Steady gait to exit with all belongings.  ?

## 2023-03-29 NOTE — Discharge Instructions (Addendum)
On today's visit.  Please take 1 tablet 3 times a day for the next 7 days.  You were also given medication to help with pain, please take 1 tablet up to 3 times a day to help with pain.  You will need to alternate follow-up with a dentist at your earliest convenience.

## 2023-04-05 ENCOUNTER — Encounter (HOSPITAL_BASED_OUTPATIENT_CLINIC_OR_DEPARTMENT_OTHER): Payer: Self-pay | Admitting: *Deleted

## 2023-04-05 ENCOUNTER — Other Ambulatory Visit: Payer: Self-pay

## 2023-04-05 ENCOUNTER — Emergency Department (HOSPITAL_BASED_OUTPATIENT_CLINIC_OR_DEPARTMENT_OTHER)
Admission: EM | Admit: 2023-04-05 | Discharge: 2023-04-05 | Disposition: A | Discharge status: Home / Self Care | Payer: Self-pay | Attending: Emergency Medicine | Admitting: Emergency Medicine

## 2023-04-05 DIAGNOSIS — K029 Dental caries, unspecified: Secondary | ICD-10-CM | POA: Insufficient documentation

## 2023-04-05 DIAGNOSIS — K047 Periapical abscess without sinus: Secondary | ICD-10-CM | POA: Insufficient documentation

## 2023-04-05 MED ORDER — IBUPROFEN 800 MG PO TABS: 800.0000 mg | ORAL_TABLET | Freq: Three times a day (TID) | ORAL | 0 refills | Status: AC

## 2023-04-05 MED ORDER — CLINDAMYCIN HCL 150 MG PO CAPS: 300.0000 mg | ORAL_CAPSULE | Freq: Four times a day (QID) | ORAL | 0 refills | Status: DC

## 2023-04-05 MED ORDER — HYDROCODONE-ACETAMINOPHEN 5-325 MG PO TABS
1.0000 | ORAL_TABLET | Freq: Once | ORAL | Status: AC
  Administered 2023-04-05: 1 via ORAL
  Filled 2023-04-05: qty 1

## 2023-04-05 MED ORDER — CLINDAMYCIN HCL 150 MG PO CAPS
300.0000 mg | ORAL_CAPSULE | Freq: Once | ORAL | Status: AC
  Administered 2023-04-05: 300 mg via ORAL
  Filled 2023-04-05: qty 2

## 2023-04-05 NOTE — ED Provider Notes (Signed)
EMERGENCY DEPARTMENT AT [facility]  Provider Note  CSN: [id number] Arrival date & time: 04/05/23 0148  History Chief Complaint  Patient presents with   Dental Pain    Frank Salinas is a 32 y.o. male with history of poor dentition and recurrent infections was in the ED 4/4 for same, given Rx for PCN which he took as directed without improvement. He recently started a job which offers benefits and will be getting dental insurance soon. He does not otherwise have access to a dentist. Denies fever or drainage.    Home Medications Prior to Admission medications   Medication Sig Start Date End Date Taking? Authorizing Provider  clindamycin (CLEOCIN) 150 MG capsule Take 2 capsules (300 mg total) by mouth 4 (four) times daily. 04/05/23  Yes Pollyann Savoy, MD  busPIRone (BUSPAR) 7.5 MG tablet Take 7.5 mg by mouth 3 (three) times daily.    [provider]  ibuprofen (ADVIL) 800 MG tablet Take 1 tablet (800 mg total) by mouth 3 (three) times daily for 7 days. 04/05/23 04/12/23  Pollyann Savoy, MD  penicillin v potassium (VEETID) 500 MG tablet Take 1 tablet (500 mg total) by mouth 3 (three) times daily for 7 days. 03/29/23 04/05/23  Claude Manges, PA-C     Allergies    Patient has no known allergies.   Review of Systems   Review of Systems Please see HPI for pertinent positives and negatives  Physical Exam BP (!) 151/109 (BP Location: Right Arm)   Pulse 80   Temp 98.3 F (36.8 C) (Oral)   Resp 18   Ht 5\' 6"  (1.676 m)   Wt 103 kg   SpO2 98%   BMI 36.64 kg/m   Physical Exam Vitals and nursing note reviewed.  HENT:     Head: Normocephalic.     Nose: Nose normal.     Mouth/Throat:     Comments: Widespread dental decay, moderate induration and erythema to the R lower molar region. No focal fluctuance or signs of deep tissue infection/ludwig's Eyes:     Extraocular Movements: Extraocular movements intact.  Pulmonary:     Effort: Pulmonary effort is  normal.  Musculoskeletal:        General: Normal range of motion.     Cervical back: Neck supple.  Skin:    Findings: No rash (on exposed skin).  Neurological:     Mental Status: He is alert and oriented to person, place, and time.  Psychiatric:        Mood and Affect: Mood normal.     ED Results / Procedures / Treatments   EKG None  Procedures Procedures  Medications Ordered in the ED Medications  HYDROcodone-acetaminophen (NORCO/VICODIN) 5-325 MG per tablet 1 tablet (has no administration in time range)  clindamycin (CLEOCIN) capsule 300 mg (has no administration in time range)    Initial Impression and Plan  Patient with persistent dental infection after a course of PCN. Will give clinda to see if that works better. 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   ED Course       MDM Rules/Calculators/A&P Medical Decision Making Problems Addressed: Dental infection: chronic illness or injury with exacerbation, progression, or side effects of treatment  Risk Prescription drug management.     Final Clinical Impression(s) / ED Diagnoses Final diagnoses:  Dental infection    Rx / DC Orders ED Discharge Orders  Ordered    clindamycin (CLEOCIN) 150 MG capsule  4 times daily        04/05/23 0233    ibuprofen (ADVIL) 800 MG tablet  3 times daily        04/05/23 0233             Pollyann Savoy, MD 04/05/23 [phone]

## 2023-04-05 NOTE — ED Triage Notes (Signed)
Pt has had dental pain since 03-27-23. Was seen here on 4-4 and placed on antibiotics. States pain/swelling has not improved. C/o right upper and lower pain. Last dose of ibuprofen at 2300. Denies any fevers. Does not have dentist.

## 2024-07-19 ENCOUNTER — Emergency Department (HOSPITAL_BASED_OUTPATIENT_CLINIC_OR_DEPARTMENT_OTHER): Admission: EM | Admit: 2024-07-19 | Discharge: 2024-07-19 | Disposition: A | Discharge status: Home / Self Care

## 2024-07-19 ENCOUNTER — Encounter (HOSPITAL_BASED_OUTPATIENT_CLINIC_OR_DEPARTMENT_OTHER): Payer: Self-pay | Admitting: Emergency Medicine

## 2024-07-19 DIAGNOSIS — F17200 Nicotine dependence, unspecified, uncomplicated: Secondary | ICD-10-CM | POA: Diagnosis not present

## 2024-07-19 DIAGNOSIS — K0889 Other specified disorders of teeth and supporting structures: Secondary | ICD-10-CM | POA: Diagnosis present

## 2024-07-19 DIAGNOSIS — K047 Periapical abscess without sinus: Secondary | ICD-10-CM | POA: Diagnosis not present

## 2024-07-19 MED ORDER — AMOXICILLIN-POT CLAVULANATE 875-125 MG PO TABS: 1.0000 | ORAL_TABLET | Freq: Two times a day (BID) | ORAL | 0 refills | Status: AC

## 2024-07-19 MED ORDER — HYDROCODONE-ACETAMINOPHEN 5-325 MG PO TABS: 1.0000 | ORAL_TABLET | Freq: Four times a day (QID) | ORAL | 0 refills | Status: AC | PRN

## 2024-07-19 MED ORDER — AMOXICILLIN-POT CLAVULANATE 875-125 MG PO TABS
1.0000 | ORAL_TABLET | Freq: Once | ORAL | Status: AC
  Administered 2024-07-19: 1 via ORAL
  Filled 2024-07-19: qty 1

## 2024-07-19 MED ORDER — OXYCODONE-ACETAMINOPHEN 5-325 MG PO TABS
1.0000 | ORAL_TABLET | Freq: Once | ORAL | Status: AC
  Administered 2024-07-19: 1 via ORAL
  Filled 2024-07-19: qty 1

## 2024-07-19 NOTE — ED Triage Notes (Signed)
 Pt arrived with R back upper and lower tooth pain that started last night/this am.

## 2024-07-19 NOTE — ED Provider Notes (Signed)
 [place] EMERGENCY DEPARTMENT AT [facility] Provider Note   CSN: [id number] Arrival date & time: 07/19/24  9285     Patient presents with: Dental Pain   Frank Salinas is a 33 y.o. male.   33 year old male with past medical history of tobacco abuse presenting to the emergency department today with dental pain.  The patient has been having pain around his right back lower molar on the right that started yesterday.  The patient denies any difficulty breathing or swallowing.  He reports that he is waiting on his dental insurance and does not have a dentist to follow-up with.  He does have poor dentition and has had dental infections multiple times in the past.  Came to the ER today for further evaluation regarding this.  He denies any fevers.  He has been taking high-dose ibuprofen  for pain in addition to Tylenol  with no improvement.   Dental Pain      Prior to Admission medications   Medication Sig Start Date End Date Taking? Authorizing Provider  amoxicillin -clavulanate (AUGMENTIN ) 875-125 MG tablet Take 1 tablet by mouth every 12 (twelve) hours. 07/19/24  Yes Ula Prentice SAUNDERS, MD  HYDROcodone -acetaminophen  (NORCO/VICODIN) 5-325 MG tablet Take 1 tablet by mouth every 6 (six) hours as needed. 07/19/24  Yes Ula Prentice SAUNDERS, MD  busPIRone (BUSPAR) 7.5 MG tablet Take 7.5 mg by mouth 3 (three) times daily.    [provider]    Allergies: Patient has no known allergies.    Review of Systems  HENT:  Positive for dental problem.   All other systems reviewed and are negative.   Updated Vital Signs BP (!) 156/102 (BP Location: Right Arm) Comment: patient has not taken bp meds  Pulse 75   Temp 98.3 F (36.8 C) (Oral)   Resp 18   Ht 5' 6 (1.676 m)   Wt 86.2 kg   SpO2 100%   BMI 30.67 kg/m   Physical Exam Vitals and nursing note reviewed.   Gen: NAD Eyes: PERRL, EOMI HEENT: Poor dentition throughout, the patient does have some gingival erythema noted around tooth  #31 with no posterior oropharyngeal swelling noted Neck: trachea midline Resp: clear to auscultation bilaterally Card: RRR, no murmurs, rubs, or gallops Psyc: acting appropriately\  (all labs ordered are listed, but only abnormal results are displayed) Labs Reviewed - No data to display  EKG: None  Radiology: No results found.   Procedures   Medications Ordered in the ED  amoxicillin -clavulanate (AUGMENTIN ) 875-125 MG per tablet 1 tablet (has no administration in time range)  oxyCODONE -acetaminophen  (PERCOCET/ROXICET) 5-325 MG per tablet 1 tablet (has no administration in time range)                                    Medical Decision Making 33 year old male with past medical history of tobacco abuse and dental caries presenting to the emergency department today with dental pain.  The patient does have some gingival erythema consistent with likely dental abscess.  There are no findings on exam consistent with deep space soft tissue infection at this time.  Will cover the patient with Augmentin .  He has been taking Tylenol  and ibuprofen  at home with no improvement.  Will give a short course of pain medication to go home with in addition to antibiotics.  He will be discharged with precautions with  Risk Prescription drug management.  Final diagnoses:  Dental infection    ED Discharge Orders          Ordered    amoxicillin -clavulanate (AUGMENTIN ) 875-125 MG tablet  Every 12 hours        07/19/24 0732    HYDROcodone -acetaminophen  (NORCO/VICODIN) 5-325 MG tablet  Every 6 hours PRN        07/19/24 0732               Ula Prentice SAUNDERS, MD 07/19/24 [phone]

## 2024-07-19 NOTE — Discharge Instructions (Signed)
 Please take the antibiotics as prescribed.  Continue to take 800 mg of ibuprofen  every 6 hours.  If you are still having pain take the Norco.  Do not drive or drink alcohol taking Norco as it may make you drowsy.  Return to the ER for worsening symptoms.
